# Patient Record
Sex: Male | Born: 1950 | Race: White | Hispanic: No | Marital: Married | Smoking: Former smoker
Health system: Southern US, Community
[De-identification: ages and names within clinical notes are randomized; demographics above are authoritative.]

## PROBLEM LIST (undated history)

## (undated) DIAGNOSIS — R760 Raised antibody titer: Secondary | ICD-10-CM

## (undated) DIAGNOSIS — M1990 Unspecified osteoarthritis, unspecified site: Secondary | ICD-10-CM

## (undated) DIAGNOSIS — Z87898 Personal history of other specified conditions: Secondary | ICD-10-CM

## (undated) DIAGNOSIS — E042 Nontoxic multinodular goiter: Secondary | ICD-10-CM

## (undated) DIAGNOSIS — I10 Essential (primary) hypertension: Secondary | ICD-10-CM

## (undated) DIAGNOSIS — C801 Malignant (primary) neoplasm, unspecified: Secondary | ICD-10-CM

## (undated) DIAGNOSIS — I2699 Other pulmonary embolism without acute cor pulmonale: Secondary | ICD-10-CM

## (undated) DIAGNOSIS — R399 Unspecified symptoms and signs involving the genitourinary system: Secondary | ICD-10-CM

## (undated) DIAGNOSIS — I2510 Atherosclerotic heart disease of native coronary artery without angina pectoris: Secondary | ICD-10-CM

## (undated) DIAGNOSIS — Z86711 Personal history of pulmonary embolism: Secondary | ICD-10-CM

## (undated) DIAGNOSIS — Z9889 Other specified postprocedural states: Secondary | ICD-10-CM

## (undated) DIAGNOSIS — C679 Malignant neoplasm of bladder, unspecified: Secondary | ICD-10-CM

## (undated) DIAGNOSIS — E782 Mixed hyperlipidemia: Secondary | ICD-10-CM

## (undated) DIAGNOSIS — E785 Hyperlipidemia, unspecified: Secondary | ICD-10-CM

## (undated) DIAGNOSIS — Z7901 Long term (current) use of anticoagulants: Secondary | ICD-10-CM

## (undated) HISTORY — PX: HERNIA REPAIR: SHX51

## (undated) HISTORY — PX: REVISION AMPUTATION OF FINGER: SHX2346

## (undated) HISTORY — DX: Essential (primary) hypertension: I10

## (undated) HISTORY — PX: LACERATION REPAIR: SHX5168

## (undated) HISTORY — DX: Malignant (primary) neoplasm, unspecified: C80.1

## (undated) HISTORY — PX: SEPTOPLASTY: SUR1290

## (undated) HISTORY — PX: TRANSURETHRAL RESECTION OF BLADDER TUMOR: SHX2575

## (undated) HISTORY — PX: UMBILICAL HERNIA REPAIR: SHX196

## (undated) HISTORY — PX: THYROID LOBECTOMY: SHX420

## (undated) HISTORY — PX: FRACTURE SURGERY: SHX138

---

## 1997-12-31 ENCOUNTER — Encounter: Payer: Self-pay | Admitting: Pulmonary Disease

## 1997-12-31 ENCOUNTER — Ambulatory Visit (HOSPITAL_COMMUNITY): Admission: RE | Admit: 1997-12-31 | Discharge: 1997-12-31 | Payer: Self-pay | Admitting: Pulmonary Disease

## 1998-01-04 ENCOUNTER — Ambulatory Visit (HOSPITAL_COMMUNITY): Admission: RE | Admit: 1998-01-04 | Discharge: 1998-01-04 | Payer: Self-pay | Admitting: Pulmonary Disease

## 1998-03-08 ENCOUNTER — Other Ambulatory Visit: Admission: RE | Admit: 1998-03-08 | Discharge: 1998-03-08 | Payer: Self-pay | Admitting: Otolaryngology

## 1998-08-25 ENCOUNTER — Encounter: Payer: Self-pay | Admitting: Emergency Medicine

## 1998-08-25 ENCOUNTER — Emergency Department (HOSPITAL_COMMUNITY): Admission: EM | Admit: 1998-08-25 | Discharge: 1998-08-25 | Payer: Self-pay | Admitting: Emergency Medicine

## 1998-09-04 ENCOUNTER — Ambulatory Visit (HOSPITAL_COMMUNITY): Admission: RE | Admit: 1998-09-04 | Discharge: 1998-09-04 | Payer: Self-pay | Admitting: Internal Medicine

## 1998-09-04 ENCOUNTER — Encounter: Payer: Self-pay | Admitting: Internal Medicine

## 1998-10-21 ENCOUNTER — Ambulatory Visit (HOSPITAL_COMMUNITY): Admission: RE | Admit: 1998-10-21 | Discharge: 1998-10-21 | Payer: Self-pay | Admitting: Internal Medicine

## 1998-10-21 ENCOUNTER — Encounter: Payer: Self-pay | Admitting: Internal Medicine

## 1999-09-05 ENCOUNTER — Emergency Department (HOSPITAL_COMMUNITY): Admission: EM | Admit: 1999-09-05 | Discharge: 1999-09-05 | Payer: Self-pay | Admitting: Emergency Medicine

## 1999-09-11 ENCOUNTER — Emergency Department (HOSPITAL_COMMUNITY): Admission: EM | Admit: 1999-09-11 | Discharge: 1999-09-11 | Payer: Self-pay | Admitting: *Deleted

## 1999-09-11 ENCOUNTER — Encounter: Payer: Self-pay | Admitting: Emergency Medicine

## 2000-02-15 ENCOUNTER — Emergency Department (HOSPITAL_COMMUNITY): Admission: EM | Admit: 2000-02-15 | Discharge: 2000-02-15 | Payer: Self-pay | Admitting: Emergency Medicine

## 2000-02-17 ENCOUNTER — Emergency Department (HOSPITAL_COMMUNITY): Admission: EM | Admit: 2000-02-17 | Discharge: 2000-02-17 | Payer: Self-pay | Admitting: *Deleted

## 2006-10-04 ENCOUNTER — Ambulatory Visit: Payer: Self-pay | Admitting: Cardiology

## 2006-10-04 ENCOUNTER — Inpatient Hospital Stay (HOSPITAL_COMMUNITY): Admission: EM | Admit: 2006-10-04 | Discharge: 2006-10-05 | Payer: Self-pay | Admitting: Emergency Medicine

## 2006-10-07 ENCOUNTER — Ambulatory Visit: Payer: Self-pay

## 2006-10-21 ENCOUNTER — Ambulatory Visit: Payer: Self-pay | Admitting: Cardiology

## 2006-11-18 ENCOUNTER — Encounter: Admission: RE | Admit: 2006-11-18 | Discharge: 2006-11-18 | Payer: Self-pay | Admitting: Internal Medicine

## 2006-12-07 ENCOUNTER — Encounter: Admission: RE | Admit: 2006-12-07 | Discharge: 2006-12-07 | Payer: Self-pay | Admitting: Internal Medicine

## 2006-12-07 ENCOUNTER — Encounter (INDEPENDENT_AMBULATORY_CARE_PROVIDER_SITE_OTHER): Payer: Self-pay | Admitting: Interventional Radiology

## 2006-12-07 ENCOUNTER — Other Ambulatory Visit: Admission: RE | Admit: 2006-12-07 | Discharge: 2006-12-07 | Payer: Self-pay | Admitting: Interventional Radiology

## 2007-03-02 ENCOUNTER — Observation Stay (HOSPITAL_COMMUNITY): Admission: EM | Admit: 2007-03-02 | Discharge: 2007-03-03 | Payer: Self-pay | Admitting: *Deleted

## 2007-03-16 ENCOUNTER — Encounter (INDEPENDENT_AMBULATORY_CARE_PROVIDER_SITE_OTHER): Payer: Self-pay | Admitting: Surgery

## 2007-03-16 ENCOUNTER — Ambulatory Visit (HOSPITAL_COMMUNITY): Admission: RE | Admit: 2007-03-16 | Discharge: 2007-03-17 | Payer: Self-pay | Admitting: Surgery

## 2008-01-02 ENCOUNTER — Encounter: Admission: RE | Admit: 2008-01-02 | Discharge: 2008-01-02 | Payer: Self-pay | Admitting: Internal Medicine

## 2008-04-13 ENCOUNTER — Encounter: Admission: RE | Admit: 2008-04-13 | Discharge: 2008-04-13 | Payer: Self-pay | Admitting: Internal Medicine

## 2008-05-08 ENCOUNTER — Ambulatory Visit (HOSPITAL_COMMUNITY): Admission: RE | Admit: 2008-05-08 | Discharge: 2008-05-08 | Payer: Self-pay | Admitting: *Deleted

## 2008-05-08 ENCOUNTER — Encounter (INDEPENDENT_AMBULATORY_CARE_PROVIDER_SITE_OTHER): Payer: Self-pay | Admitting: *Deleted

## 2009-01-07 IMAGING — US US BIOPSY
1 series · 7 of 7 positions shown · non-contrast
Comparison: none

CLINICAL DATA: Patient with history of multinodular goiter and recent thyroid ultrasound at [HOSPITAL] at [HOSPITAL] on 11/18/06 which revealed multiple solid nodules, the largest of which is in the mid to lower pole on the left measuring 6.0 x 4.8 x 5.7 cm.  Request is now made for fine needle aspiration of this dominant mid to lower pole left thyroid  nodule. 
ULTRASOUND-GUIDED FINE NEEDLE ASPIRATION, DOMINANT  MID TO LOWER POLE LEFT THYROID NODULE:

[Series 1: us biopsy · 7 acquisitions, 7 frames shown]
[im 1/7]
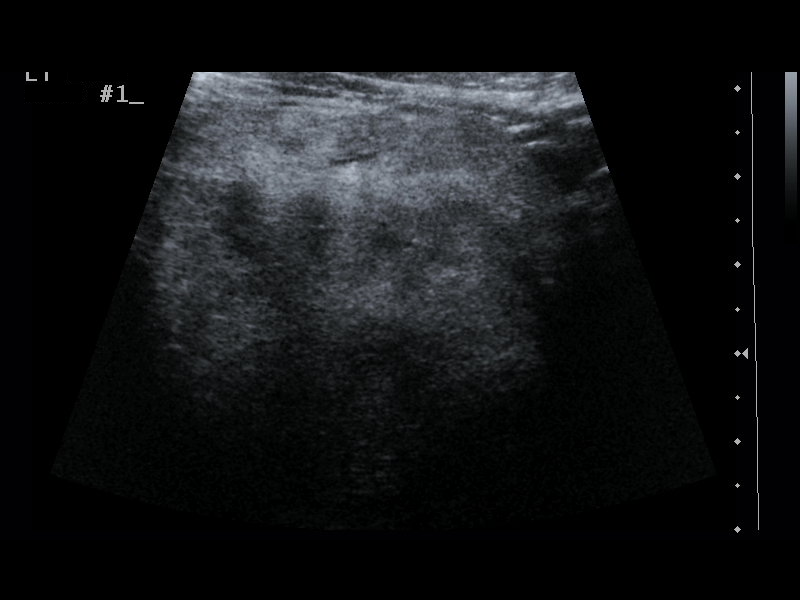
[im 2/7]
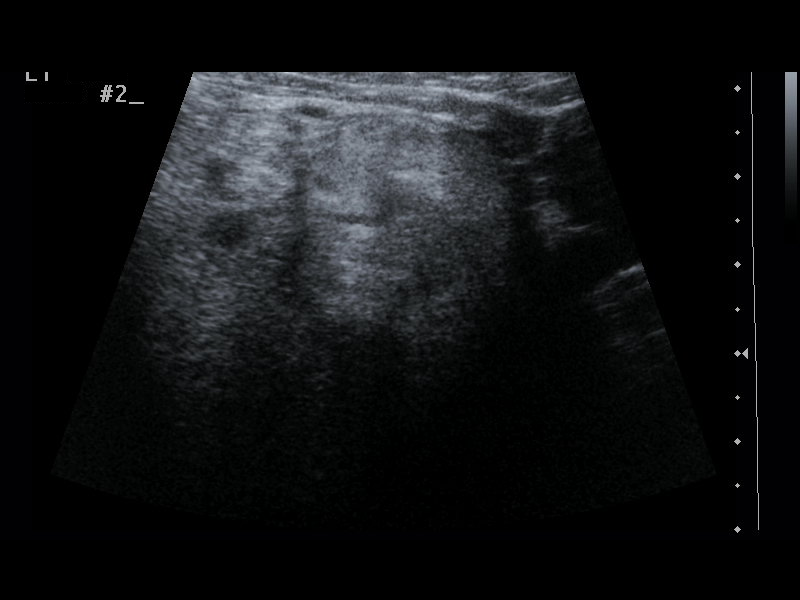
[im 3/7]
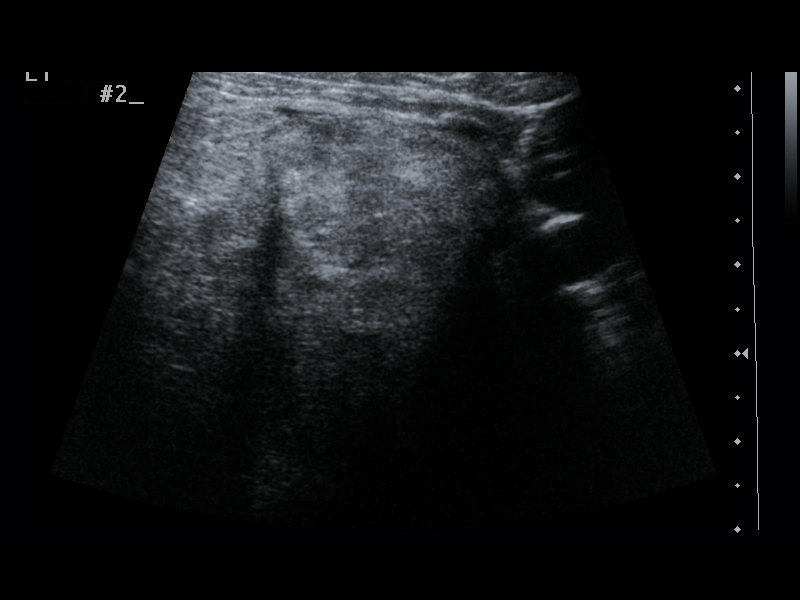
[im 4/7]
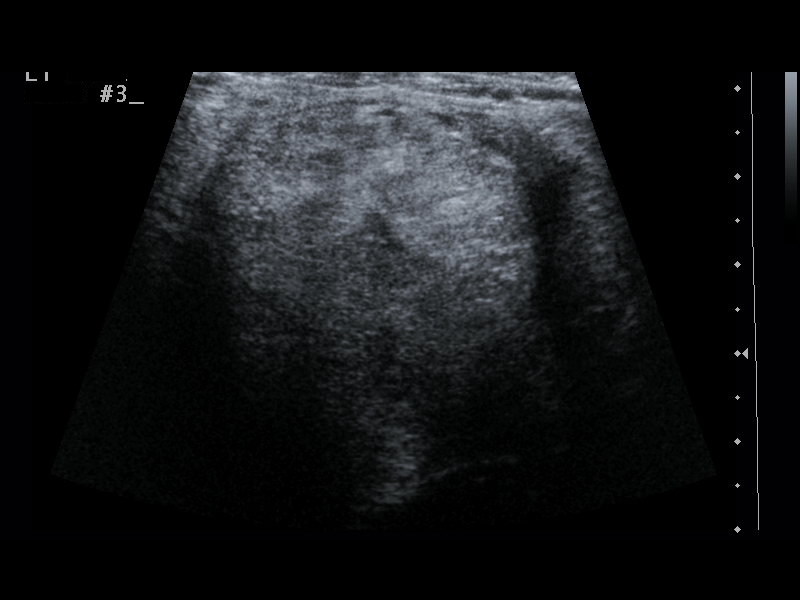
[im 5/7]
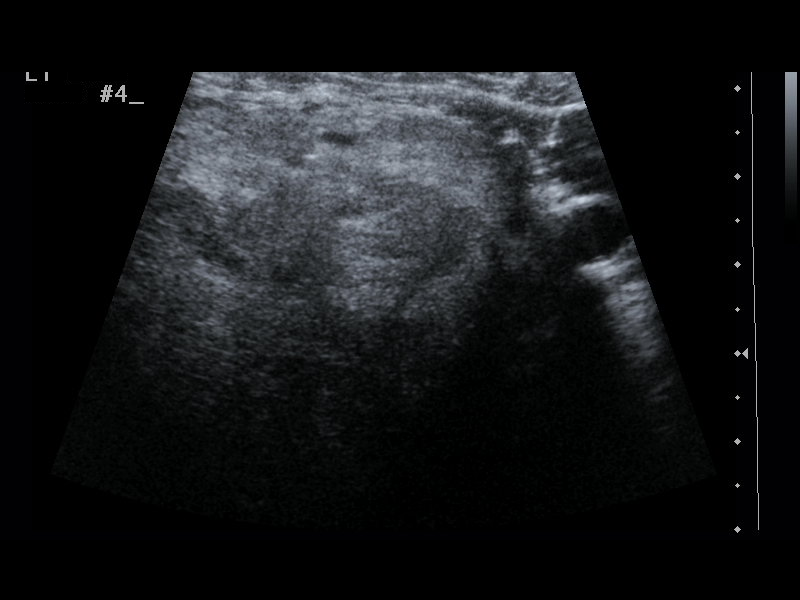
[im 6/7]
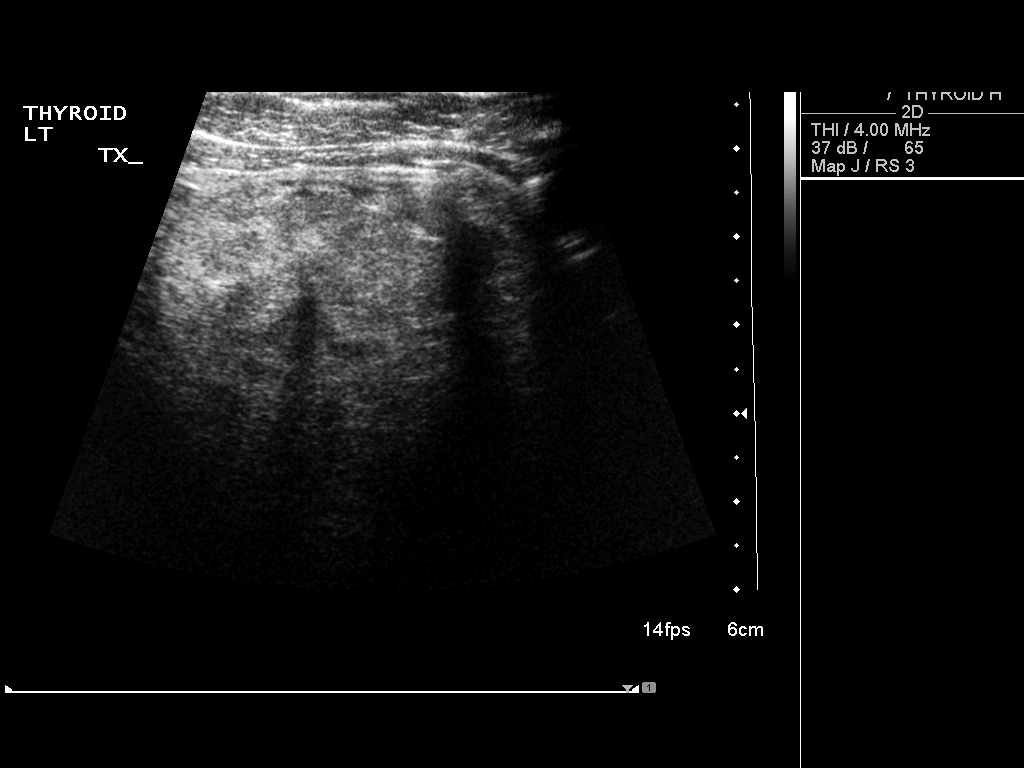
[im 7/7]
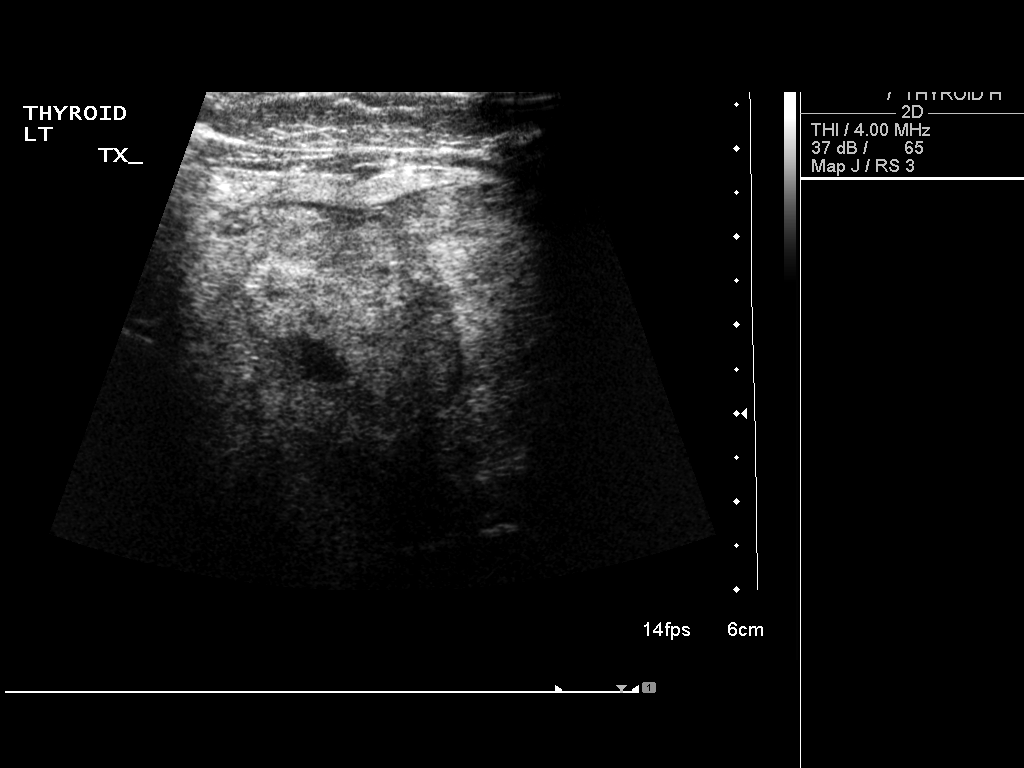

[7 of 7 positions shown; findings below may reference images not displayed]

FINDINGS: The above procedure was thoroughly discussed with the patient and written informed consent was obtained. 
Ultrasound was then performed to localize and mark an adequate site for the biopsy.  The patient was then prepped and draped in a normal sterile fashion.  1% lidocaine was used for local anesthesia.  Under direct ultrasound guidance, four passes were made using 25 gauge hypodermic needle into the dominant nodule located within the mid to lower pole of the left lobe of the thyroid.  Ultrasound confirmed placement of the needle on all four occasions.  The specimens were sent to Pathology for further analysis.  Post procedure imaging demonstrated no hematoma or immediate complication.  The patient tolerated the procedure well.
IMPRESSION: Successful ultrasound-guided fine needle aspiration, dominant mid to lower pole left thyroid nodule.  Final pathology pending.

## 2009-04-02 IMAGING — CR DG ABDOMEN ACUTE W/ 1V CHEST
4 series · 4 of 4 positions shown · non-contrast
Comparison: None.

CLINICAL DATA: Vomiting and nausea.
 ACUTE ABDOMINAL SERIES WITH CHEST ? 3 VIEW:

[w chest pa]
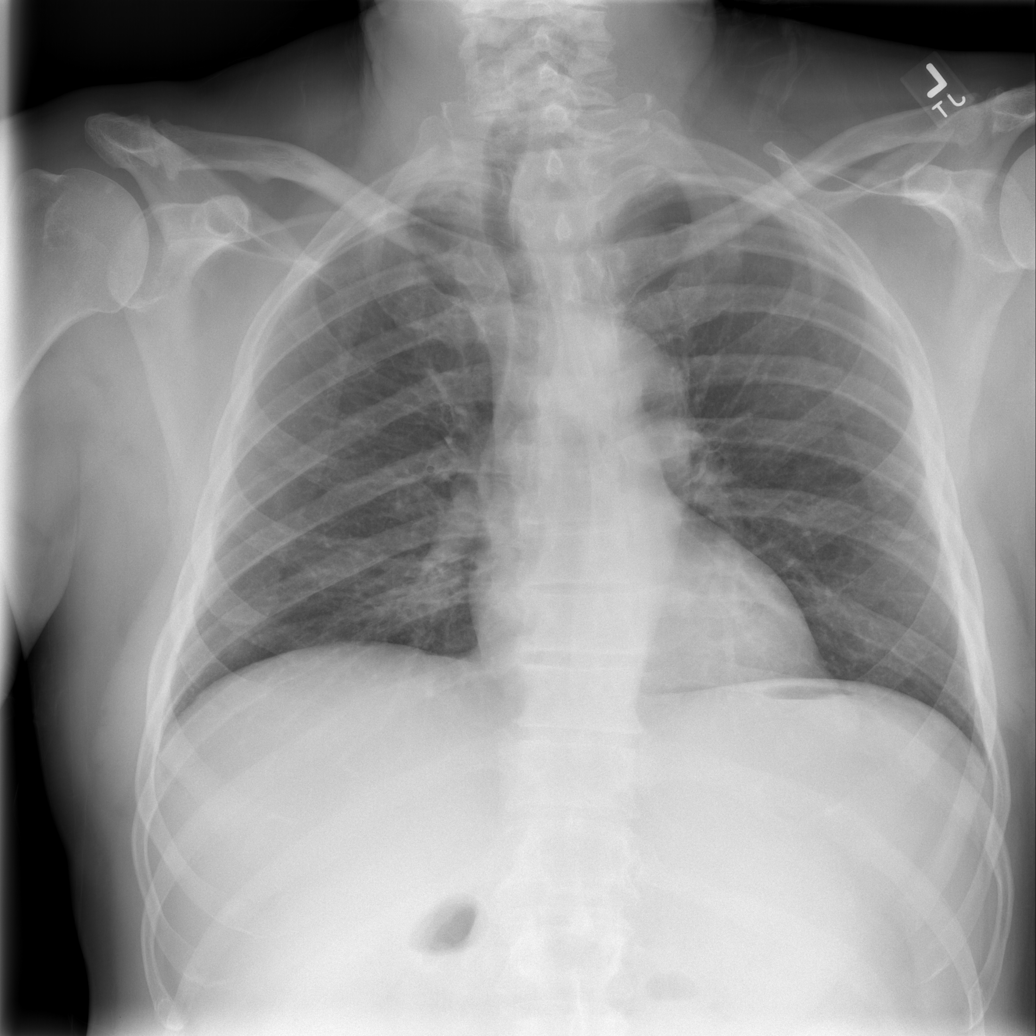

[w abdomen upright * (1 of 2)]
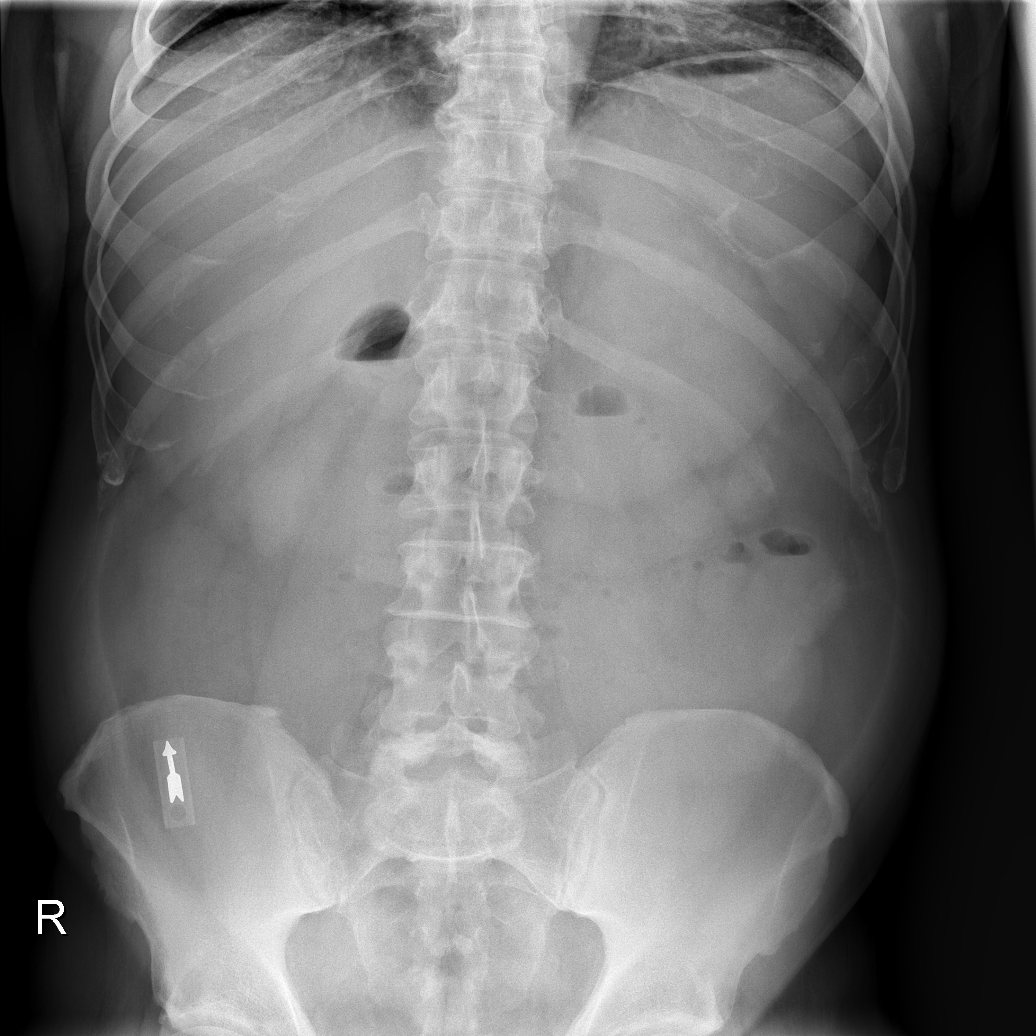

[w abdomen upright * (2 of 2)]
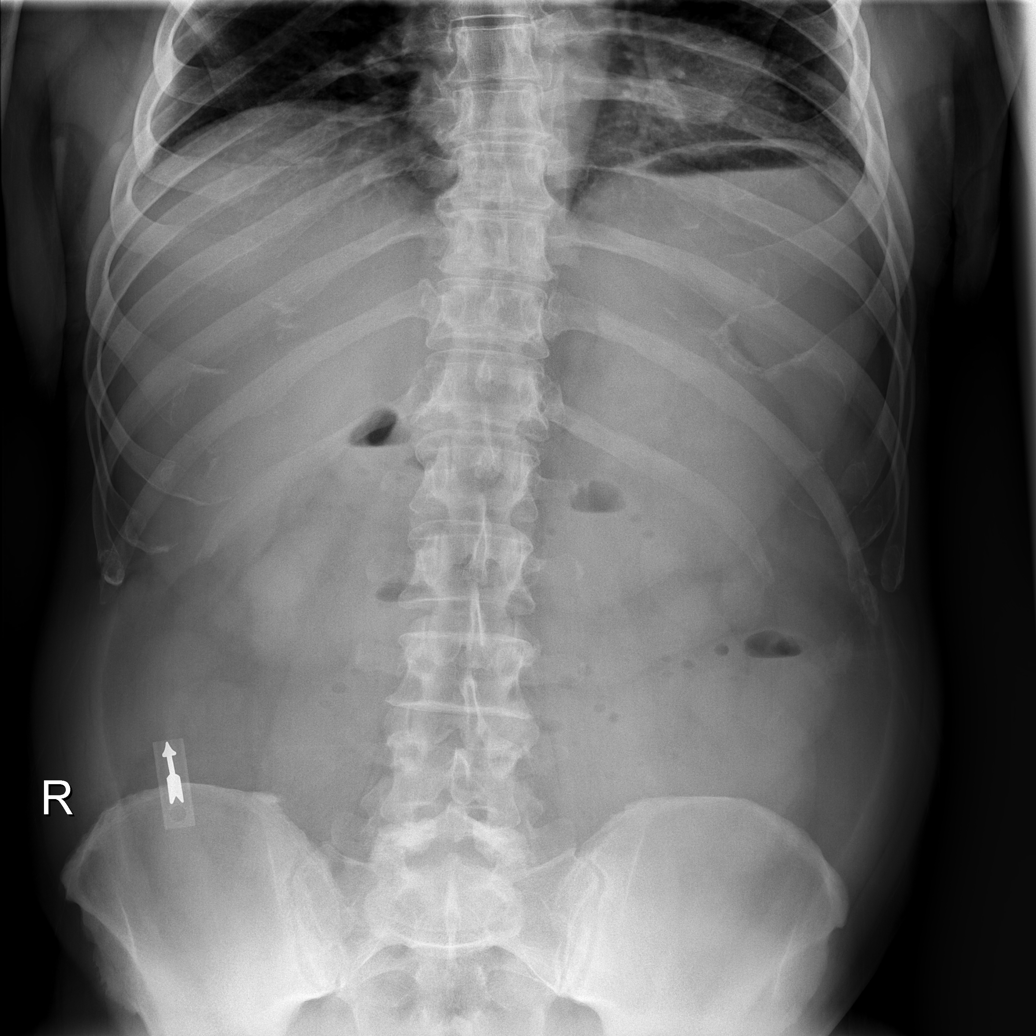

[t abdomen supine]
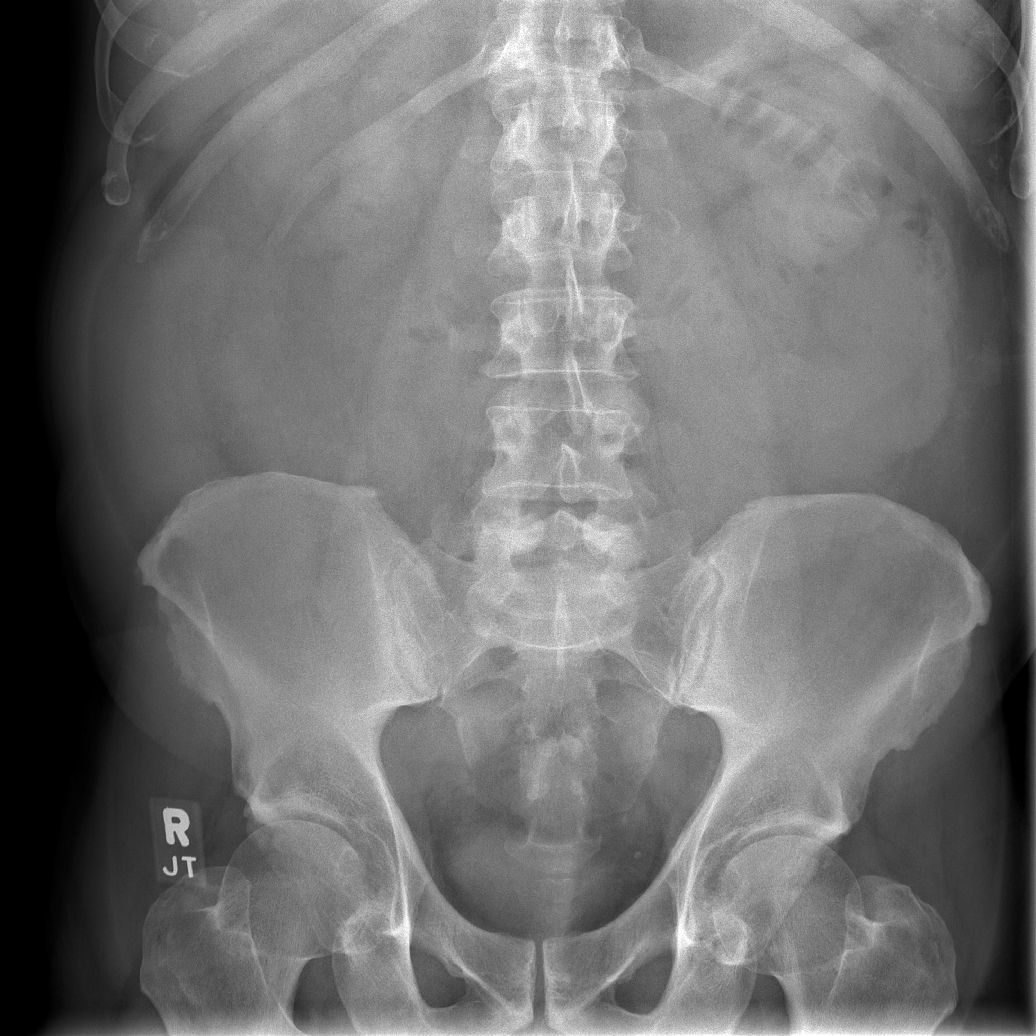

[4 of 4 positions shown; findings below may reference images not displayed]

FINDINGS: A single view of the chest shows the lungs to be clear.  The heart is within normal limits in size.  There is some deviation of the superior tracheal air shadow to the right of midline suggesting possible enlargement of the thyroid gland.  
 Supine and erect views of the abdomen show very little bowel gas with a large amount of fluid filling the stomach.  In view of the large amount of fluid present, a partial small bowel obstruction cannot be excluded, although no evidence of bowel obstruction is seen with certainty.  There are a few scattered air fluid levels.  If clinical concern exists for small bowel obstruction, CT of the abdomen and pelvis would be recommended.  No opaque calculi are noted.  No free air is seen.
IMPRESSION: 1. Fluid filled bowel with very little air present.  Difficult to exclude small bowel obstruction with fluid filled bowel.  Consider CT abdomen and pelvis if warranted clinically.  No free air.
 2. No active lung disease. 
 3. Probable enlargement of the thyroid gland.

## 2009-05-01 ENCOUNTER — Encounter: Admission: RE | Admit: 2009-05-01 | Discharge: 2009-05-01 | Payer: Self-pay | Admitting: Internal Medicine

## 2009-06-26 ENCOUNTER — Encounter: Admission: RE | Admit: 2009-06-26 | Discharge: 2009-06-26 | Payer: Self-pay | Admitting: Endocrinology

## 2009-06-26 ENCOUNTER — Other Ambulatory Visit: Admission: RE | Admit: 2009-06-26 | Discharge: 2009-06-26 | Payer: Self-pay | Admitting: Interventional Radiology

## 2010-03-16 ENCOUNTER — Encounter: Payer: Self-pay | Admitting: Internal Medicine

## 2010-05-05 ENCOUNTER — Emergency Department (HOSPITAL_COMMUNITY)
Admission: EM | Admit: 2010-05-05 | Discharge: 2010-05-05 | Disposition: A | Discharge status: Home / Self Care | Payer: BC Managed Care – PPO | Attending: Emergency Medicine | Admitting: Emergency Medicine

## 2010-05-05 DIAGNOSIS — W261XXA Contact with sword or dagger, initial encounter: Secondary | ICD-10-CM | POA: Insufficient documentation

## 2010-05-05 DIAGNOSIS — S61209A Unspecified open wound of unspecified finger without damage to nail, initial encounter: Secondary | ICD-10-CM | POA: Insufficient documentation

## 2010-05-05 DIAGNOSIS — W260XXA Contact with knife, initial encounter: Secondary | ICD-10-CM | POA: Insufficient documentation

## 2010-05-05 DIAGNOSIS — Y9389 Activity, other specified: Secondary | ICD-10-CM | POA: Insufficient documentation

## 2010-05-05 DIAGNOSIS — I10 Essential (primary) hypertension: Secondary | ICD-10-CM | POA: Insufficient documentation

## 2010-05-05 DIAGNOSIS — E785 Hyperlipidemia, unspecified: Secondary | ICD-10-CM | POA: Insufficient documentation

## 2010-05-13 LAB — CULTURE, ROUTINE-ABSCESS

## 2010-06-25 ENCOUNTER — Other Ambulatory Visit: Payer: Self-pay | Admitting: Internal Medicine

## 2010-06-25 DIAGNOSIS — R221 Localized swelling, mass and lump, neck: Secondary | ICD-10-CM

## 2010-07-04 ENCOUNTER — Encounter (HOSPITAL_BASED_OUTPATIENT_CLINIC_OR_DEPARTMENT_OTHER)
Admission: RE | Admit: 2010-07-04 | Discharge: 2010-07-04 | Disposition: A | Discharge status: Home / Self Care | Payer: BC Managed Care – PPO | Source: Ambulatory Visit | Attending: Surgery | Admitting: Surgery

## 2010-07-04 ENCOUNTER — Other Ambulatory Visit: Payer: Self-pay | Admitting: Surgery

## 2010-07-04 ENCOUNTER — Ambulatory Visit
Admission: RE | Admit: 2010-07-04 | Discharge: 2010-07-04 | Disposition: A | Discharge status: Home / Self Care | Payer: BC Managed Care – PPO | Source: Ambulatory Visit | Attending: Surgery | Admitting: Surgery

## 2010-07-04 DIAGNOSIS — Z01811 Encounter for preprocedural respiratory examination: Secondary | ICD-10-CM

## 2010-07-04 LAB — POCT I-STAT, CHEM 8
Calcium, Ion: 1.15 mmol/L (ref 1.12–1.32)
Chloride: 104 mEq/L (ref 96–112)
HCT: 48 % (ref 39.0–52.0)
TCO2: 25 mmol/L (ref 0–100)

## 2010-07-08 NOTE — Op Note (Signed)
NAME:  Gerald Morales, Gerald Morales              ACCOUNT NO.:  [id number]   MEDICAL RECORD NO.:  [id number]          PATIENT TYPE:  AMB   LOCATION:  ENDO                         FACILITY:  [facility]   PHYSICIAN:  Georgiana Spinner, M.D.    DATE OF BIRTH:  [birth year]   DATE OF PROCEDURE:  05/08/2008  DATE OF DISCHARGE:                               OPERATIVE REPORT   PROCEDURE:  Colonoscopy.   INDICATIONS:  Colon cancer screening.   ANESTHESIA:  Fentanyl 75 mcg, Versed 8 mg.   PROCEDURE:  With the patient mildly sedated in the left lateral  decubitus position, a rectal examination was performed which was  unremarkable.  Prostate felt normal to my exam.  Subsequently, the  Pentax videoscopic pediatric colonoscope was inserted in the rectum and  passed under direct vision to the cecum, identified by ileocecal valve  and appendiceal orifice, both of which were photographed.  From this  point, the colonoscope was slowly withdrawn, taking circumferential  views of colonic mucosa, stopping only in the rectum where a small polyp  was seen, photographed and removed using hot biopsy forceps technique  setting of 20/150 blended current.  We then pulled back to the distal  rectum which appeared otherwise normal on direct view and showed small  hemorrhoids on retroflexed view.  The endoscope was straightened and  withdrawn.  The patient's vital signs and pulse oximeter remained  stable.  The patient tolerated the procedure well without apparent  complications.   FINDINGS:  Small internal hemorrhoids, small polyp of rectum.  Await  biopsy report.  The patient will call me for results and follow-up with  me as an outpatient.           ______________________________  Georgiana Spinner, M.D.     GMO/MEDQ  D:  05/08/2008  T:  05/08/2008  Job:  [id number]

## 2010-07-08 NOTE — H&P (Signed)
NAME:  Gerald Morales, HURLBUT NO.:  [id number]   MEDICAL RECORD NO.:  [id number]          PATIENT TYPE:  EMS   LOCATION:  ED                           FACILITY:  [facility]   PHYSICIAN:  Altha Harm, MDDATE OF BIRTH:  October 31, 1950   DATE OF ADMISSION:  03/02/2007  DATE OF DISCHARGE:                              HISTORY & PHYSICAL   CHIEF COMPLAINT:  Vomiting and diarrhea x1 day.   HISTORY OF PRESENT ILLNESS:  This is a 60 year old gentleman who has  been in his normal state of health until approximately 24 hours ago.  He  started having nausea and vomiting.  The patient states that his emesis  is non-bilious, non-hematemesis.  He has also been having diarrhea.  He  states that his nausea, vomiting and diarrhea have been episodes too  numerous to count.  The patient states that he had a sick contact in his  grandson, who has had the same ailment approximately three to four days  ago.  The patient states he has been having some chills but he has had  no measured fevers.  He has had no recent travel.  He has had no unusual  food and in particular no use of peanut butter.  There are no other  family members who have had this, except for his grandson, whom he spent  quite a bit of time with.  He states that he has had some decrease in  urine output.  His last emesis was approximately two hours ago while  here in the emergency room.   PAST MEDICAL HISTORY:  1. Significant for hypertension.  2. Hyperlipidemia.  3. Retrosternal goiter.  Surgery is scheduled for March 16, 2007.  4. Chronic back pain.   FAMILY HISTORY:  Significant for hypertension.   SOCIAL HISTORY:  The patient denies any tobacco, alcohol or drug use.   CURRENT MEDICATIONS:  1. Aspirin 81 mg p.o. daily.  2. Crestor.  3. Benicar; however, the patient does not know the doses of these      current medications.   ALLERGIES:  No known drug allergies.   PRIMARY CARE PHYSICIAN:  Dr. Soyla Murphy. Pharr.   REVIEW OF SYSTEMS:  On a systems review, all systems are negative except  as noted in the HPI.   LABORATORY DATA:  In the emergency room laboratory studies showed the  following:  White blood cell count 13.3, hemoglobin 18.4, hematocrit  52.6, platelets 196.  Sodium 138, potassium 3.4, chloride 104,  bicarbonate 22, BUN 23, creatinine 1.44.   PHYSICAL EXAMINATION:  GENERAL:  The patient is laying in bed, in no  acute distress.  VITAL SIGNS:  Temperature 98.2 degrees orally, blood pressure 130/87,  heart rate 82, respirations 16, O2 saturation 97% on room air.  HEENT:  Normocephalic and atraumatic.  Pupils equal, round, reactive to  light and accommodation.  Extraocular movements intact.  Tympanic  membranes clear bilaterally.  Oropharynx moist.  No exudates, erythema  or lesions noted.  NECK:  Trachea midline.  No masses, no thyromegaly, no jugular venous  distention, no carotid bruits.  LUNGS:  The patient has normal respiratory effort.  Equal excursion  bilaterally.  Clear to auscultation.  No rales, rhonchi or wheezing noted.  CARDIOVASCULAR:  He is mildly tachycardic.  Normal S1 and S2.  No  murmurs, rubs or gallops noted.  PMI is non-displaced.  No heaves or  thrills on palpation.  ABDOMEN:  Mildly distended, obese, soft, nontender.  No masses, no  hepatosplenomegaly noted.  No guarding, no rebound.  LYMPHS:  No cervical, axillary or inguinal lymphadenopathy noted.  MUSCULOSKELETAL:  No swelling or erythema around the joints.  A full  range of motion in the joints.  There is no spinal tenderness.  NEUROLOGIC:  Cranial nerves II-XII  grossly intact.  No focal  neurological deficits noted.  Deep tendon reflexes 2+ bilaterally in the  upper and lower extremities.  The patient is able to move all  extremities against gravity with resistance.  Sensation to light touch  and proprioception.  PSYCHIATRIC:  Alert and oriented x3.  Good insight and cognition.  Good  recent and remote  recall.   ASSESSMENT/PLAN:  This is a patient who presents with gastroenteritis,  likely viral.  The patient has a mildly elevated white blood cell count  which is attributed to hemoconcentration.  If the patient continues to  have diarrhea, stool will be sent for evaluation, including Clostridium  difficile and white blood cells.  At this point I will not continue to  treat the patient with Imodium, but rather hydrate the patient and allow  for the gastroenteritis to resolve itself.  It is likely a self-limited  disorder and as long as the patient is able to maintain oral hydration  without any further deterioration in his clinical status, he should be  able to be discharged within the next 24-48 hours.      Altha Harm, MD  Electronically Signed     MAM/MEDQ  D:  03/02/2007  T:  03/02/2007  Job:  [id number]   cc:   Soyla Murphy. Renne Crigler, M.D.  Fax: [phone]

## 2010-07-08 NOTE — Discharge Summary (Signed)
NAME:  Gerald Morales, Gerald Morales              ACCOUNT NO.:  [id number]   MEDICAL RECORD NO.:  [id number]          PATIENT TYPE:  INP   LOCATION:  1434                         [facility]ACILITY:  [facility]   PHYSICIAN:  Isidor Holts, M.D.  DATE OF BIRTH:  07-10-1950   DATE OF ADMISSION:  10/04/2006  DATE OF DISCHARGE:  10/05/2006                               DISCHARGE SUMMARY   PRIMARY MEDICAL DOCTOR:  Soyla Murphy. Renne Crigler, M.D.   DISCHARGE DIAGNOSES:  1. Atypical chest pain, rule out coronary artery disease.  2. Back pain, likely musculoskeletal, +/- radiculopathy.  3. Smoking history.  4. Hypertension.  5. Dyslipidemia.  6. Retrosternal goiter.   DISCHARGE MEDICATIONS:  1. Aspirin 81 mg daily (over-the-counter).  2. Crestor 20 mg p.o. daily (more than 10 mg p.o. daily).  3. Lisinopril 40 mg p.o. daily.  4. Darvocet N-100 one p.o. p.r.n. q.6 h .   PROCEDURES:  Chest CT angiogram dated October 04, 2006.  This showed no  pulmonary embolus, no borderline cardiomegaly without failure, large  substernal thyroid goiter, displaces the airway to the right and narrows  the airway to 7 mm, low lung volumes.   CONSULTATIONS:  1. Madolyn Frieze Jens Som, MD, [facility], Cardiology.  2. Rollene Rotunda, MD, [facility], Cardiology.   ADMISSION HISTORY:  As H&P notes of October 04, 2006, dictated by Dr.  Marthann Schiller.  However, in brief, this is a 60 year old male, with  known history of hypertension, dyslipidemia, who presented with being in  right upper back pain, radiating towards the right chest wall anteriorly  and the right arm, associated shortness of breath, no diaphoresis or  nausea.  The patient does have a family history significant for type 2  diabetes mellitus and congestive heart failure.  He was therefore  admitted for further evaluation, investigation and management.   CLINICAL COURSE:  #1 - CHEST PAIN.  This has several atypical features.  However, the patient does have risk factors for coronary disease,  including dyslipidemia, hypertension as well as positive family history  of heart disease.  Cardiac enzymes were cycleed, and remained  unelevated.  Chest CT angiogram was done on October 04, 2006, to rule out  pulmonary embolism.  This was ruled out.  However, it did demonstrate a  large retrosternal thyroid goiter.  EKG showed no acute ischemic  changes.  Cardiology consultation was called which was kindly provided  by Dr. Angelina Sheriff and Dr. Olga Millers who have opined that the  patient has no acute coronary syndrome and was stable for discharge on  October 05, 2006, with arrangements put in place for stress Cardiolite  study on October 07, 2006.   #2 - HYPERTENSION.  The patient's blood pressure during this  hospitalization was significantly elevated between 150 systolic to 160  systolic.  He was therefore commenced on Lisinopril therapy.  Unfortunately, we are unable to use Beta blocker, because he has  persistent sinus bradycardia in the 50's.   #3 - SMOKING HISTORY.  The patient smokes cigars.  He had smoked pipes  in the past.  He has been counseled appropriately.   #4 -  DYSLIPIDEMIA. The patient's lipid profile was done and demonstrated  total cholesterol of 247, triglyceride 302, HDL 36, LDL 151.  Clearly,  the patient's anti-lipid treatment has to be escalated.  He is currently  on Crestor 10 mg p.o. daily.  We have increased this to 20 mg p.o.  daily.   #5 - MUSCULOSKELETAL BACK PAIN.  The patient continues to complain of  upper back pain.  Likely, this is due to DJD with radiculopathy.  He has  been commenced on p.r.n. Darvocet N-100.   #6 - MULTINODULAR RETROSTERNAL GOITER.  As mentioned in the clinical  course above, the patient underwent chest CT angiogram on October 04, 2006, which was negative for pulmonary embolism, but demonstrated a  large substernal goiter which displaces the airway to the right and  narrows airway to 7 mm.  Review of the patient's electronic  medical  records showed that he had a nuclear medicine thyroid scan on October 21, 1998, and this confirmed a multinodular goiter.  TSH during this  hospitalization was 0.488, i.e. euthyroid.   DISPOSITION:  The patient was considered sufficiently clinically stable  to be discharged on October 05, 2006.   DISCHARGE INSTRUCTIONS:  1. Diet:  Heart healthy.  2. Activity:  As tolerated .   FOLLOWUP INSTRUCTIONS:  The patient is instructed to show up for stress  Cardiolite study on October 07, 2006, at 7:45 a.m.  This has already been  scheduled, and the patient has been given details of appointment.  He is  to  follow up subsequently with Dr. Olga Millers in his office.  Dr  .Ludwig Clarks office will contact the patient to schedule appointment.  In  addition, the patient is to follow up routinely with his primary M.D.,  Dr. Merri Brunette in 1-2 weeks.      Isidor Holts, M.D.  Electronically Signed     CO/MEDQ  D:  10/05/2006  T:  10/05/2006  Job:  [id number]   cc:   Soyla Murphy. Renne Crigler, M.D.  Fax: [phone]   Madolyn Frieze. Jens Som, MD, [employer]  [address]  [address]  [place]  [place] [address]   Rollene Rotunda, MD, [facility]  [address]  [address]  [place]  [place] [address]

## 2010-07-08 NOTE — Consult Note (Signed)
NAME:  Gerald Morales              ACCOUNT NO.:  [id number]   MEDICAL RECORD NO.:  [id number]          PATIENT TYPE:  INP   LOCATION:  1434                         FACILITY:  [facility]   PHYSICIAN:  Madolyn Frieze. Jens Som, MD, FACCDATE OF BIRTH:  07-Jan-1951   DATE OF CONSULTATION:  10/04/2006  DATE OF DISCHARGE:                                 CONSULTATION   PRIMARY CARE PHYSICIAN:  Soyla Murphy. Renne Crigler, M.D.   REASON FOR CONSULTATION:  Mr. Gerald Morales is a 60 year old white male who  presented to the [facility] Emergency Room and is being  admitted by [facility], secondary to chest and back discomfort.  He  stated yesterday that he watched the Olympics all day long and at around  9:15 p.m. he noticed an aching feeling in his right chest, as well as a  very sharp knife-like pain between his shoulder blades.  He lay down and  went to sleep.  Around 12:30 a.m. he woke up and felt his symptoms were  worse.  He could not get comfortable.  He said the discomfort did not  change with lying flat and did not get worse with movement.  He did  notice some clamminess.  He specifically denied any nausea, vomiting or  shortness of breath.  He states that the discomfort is still there, but  not as bad.  He denies prior occurrences.   ALLERGIES:  No known drug allergies.   CURRENT MEDICATIONS:  1. A blood pressure medication at home.  2. A cholesterol medication at home.  He is not sure of the names, and      he does not take these on a regular basis.   PAST MEDICAL HISTORY:  1. Hypertension, which he does not monitor.  2. Hyperlipidemia, unknown when last checked.  3. Multi-nodular goiter, unknown last evaluation.  He did have a      stress test approximately 15 years ago and was told that it was      okay.  He specifically denies any diabetes, chronic obstructive      pulmonary disease, myocardial infarction, CVA or bleeding      dyscrasias.   PAST SURGICAL HISTORY:  1. Notable for a deviated  septal repair.  2. Right finger and left hand surgery.   SOCIAL HISTORY:  He resides in [place] with his wife, daughter and  grandson.  He has two other sons.  All are alive and well.  There other  grandchildren.  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  He smokes  cigars.  He is not sure of the frequency.  He drinks one to two beers a  day.  Denies any drugs or herbal medications.  Does not follow a  specific diet.  Does not exercise.   FAMILY HISTORY:  His mother died in her 1's, with a history of  congestive heart failure, diabetes and hypertension.  His father died at  age 65, with congestive heart failure and hypertension.  He has one  brother deceased, secondary to suicide.  One sister is alive and well.   REVIEW OF SYSTEMS:  In  addition to the above, is notable for occasional  sinus congestion, glasses, upper dentures, chronic dyspnea on exertion  when the temperature in his work place is greater than 130 degrees,  coughing, snoring, bilateral knee arthralgias, occasional diarrhea that  he attributes to drinking too much beer.   PHYSICAL EXAMINATION:  GENERAL:  A well-developed and well-nourished  obese white male, in no apparent distress.  VITAL SIGNS:  Temperature 97.4 degrees, initial blood pressure 175/115,  pulse 70 and regular, respirations 18, 96% saturation on room air.  Blood pressure in the right arm is 169/98, blood pressure in the left  arm 157/98.  These were done by me.  HEENT:  Unremarkable.  NECK:  Supple without adenopathy, jugular venous distention or carotid  bruits.  He does have a large goiter.  CHEST:  Symmetrical excursion.  Clear to auscultation.  HEART:  PMI is not displaced.  A regular rate and rhythm.  I did not  appreciate any murmurs, rubs, clicks or gallops.  I did not appreciate  any abdominal or femoral bruits.  All pulses are symmetrical and intact,  particularly in his upper extremities.  ABDOMEN:  Obese, bowel sounds present, without  organomegaly, masses or  tenderness.  EXTREMITIES:  Negative for clubbing, cyanosis or edema.  MUSCULOSKELETAL:  Grossly unremarkable.  NEUROLOGIC:  Unremarkable.   LABORATORY DATA:  A chest x-ray was not done.  A chest CT, however,  showed no pulmonary embolism.  He had a large substernal goiter,  displacing the trachea to the right and narrows the trachea  approximately 7 mm.  Dr. Madolyn Frieze. Crenshaw reviewed and did not see any  evidence of dissection or aortic enlargement.   An electrocardiogram shows a normal sinus rhythm, normal axis, early  repolarization, normal intervals.  Old electrocardiograms are not  available for comparison.   Admission H&H is 16.8 and 47.6, normal indices, platelets 216, WBCs 6.  Sodium 138, potassium 3.7, BUN 15, creatinine 0.95, glucose 103.  Normal  liver function tests.  PTT 25, PT 11.9.  Point of care negative x2.  Urinalysis negative.   IMPRESSION:  1. Prolonged atypical back and chest discomfort.  Point of care      markers and electrocardiograms are unremarkable.  2. Hypertension that does not appear to be controlled.  3. Noncompliance with medications and probable medical evaluation.   RECOMMENDATIONS:  Dr. Jens Som reviewed the patient's history and spoke  with the patient and examined the patient and agrees with the above.  Dr. Jens Som feels that he could proceed with an outpatient Myoview for  further  evaluation, and then follow up.  His discomfort may be musculoskeletal;  however, primary care should address is blood pressure and recommend  checking lipids and a TSH, given his goiter, and further evaluation of  his goiter.   I will arrange an outpatient stress Myoview as well as a follow-up  appointment.      Joellyn Rued, PA-C      Madolyn Frieze Jens Som, MD, [facility]  Electronically Signed    EW/MEDQ  D:  10/04/2006  T:  10/05/2006  Job:  [id number]   cc:   Soyla Murphy. Renne Crigler, M.D.  Fax: [phone]

## 2010-07-08 NOTE — Discharge Summary (Signed)
NAME:  Gerald Morales, Gerald Morales              ACCOUNT NO.:  [id number]   MEDICAL RECORD NO.:  [id number]          PATIENT TYPE:  INP   LOCATION:  1313                         FACILITY:  [facility]   PHYSICIAN:  Altha Harm, MDDATE OF BIRTH:  1950/07/13   DATE OF ADMISSION:  03/02/2007  DATE OF DISCHARGE:  03/03/2007                               DISCHARGE SUMMARY   DISCHARGE DISPOSITION:  Home.   FINAL DISCHARGE DIAGNOSIS:  Gastroenteritis resolved.   SECONDARY DIAGNOSES:  1. Hypertension.  2. Retrosternal goiter scheduled for surgery on the 21st.  3. Hypertension.   DISCHARGE MEDICATIONS:  1. Crestor 10 mg p.o. daily.  2. Benicar 25 mg p.o. daily.   MEDICATIONS ON HOLD:  Aspirin 81 mg p.o. daily.   PRIMARY CARE PHYSICIAN:  Dr. Merri Brunette.   CODE STATUS:  Full code.   ALLERGIES:  No known drug allergies.   CHIEF COMPLAINT:  Nausea and vomiting and diarrhea.   HISTORY OF PRESENT ILLNESS:  Please see H&P dictated on March 02, 2007  for details of the HPI.   HOSPITAL COURSE:  Gastroenteritis with intractable vomiting.  The  patient was having intractable emesis and unable to keep anything down  .  The patient was brought in on an observation basis, given aggressive  hydration, and started on clear liquids.  The patient tolerated clear  liquids and is being advanced to regular diet.  If the patient tolerates  this diet, then he will be discharged home.   CONDITION AT TIME OF DISCHARGE:  The patient is afebrile.  Vital signs  stable.  No further diarrhea or emesis reported.   FOLLOWUP:  The patient to follow up with his primary care physician on a  p.r.n. basis.      Altha Harm, MD  Electronically Signed     MAM/MEDQ  D:  03/03/2007  T:  03/03/2007  Job:  [id number]   cc:   Soyla Murphy. Renne Crigler, M.D.  Fax: [phone]

## 2010-07-08 NOTE — Op Note (Signed)
NAME:  Gerald Morales, Gerald Morales              ACCOUNT NO.:  [id number]   MEDICAL RECORD NO.:  [id number]          PATIENT TYPE:  OIB   LOCATION:  2550                         FACILITY:  [facility]   PHYSICIAN:  Thornton Park. Daphine Deutscher, MD  DATE OF BIRTH:  Sep 11, 1950   DATE OF PROCEDURE:  03/16/2007  DATE OF DISCHARGE:                               OPERATIVE REPORT   CCS NUMBER:  [id number].   PREOPERATIVE DIAGNOSIS:  Substernal goiter with left thyroid lobe  compressing the trachea and pushing it to the right.   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   PROCEDURE:  Left lobectomy, isthmusectomy, examination of the right  lobe.   SURGEON:  Thornton Park. Daphine Deutscher, M.D.   ASSISTANT:  Leonie Man, M.D.   ANESTHESIA:  General endotracheal.   DESCRIPTION OF PROCEDURE:  [name] is a 60 year old white  male who has had some swallowing difficulties and was found on workup to  have a large goiter and on CT scan was found to have a linear type  compression of his trachea as it was pushed to the right side by a large  left thyroid goiter which was mainly substernal.  This was about over  8.5-cm in greatest dimension.   The patient was taken to Room 16 at [facility].  He was given general  anesthesia, intubated and was prepped with Techni-Care and draped  sterilely.  Substernal instruments were available, and Dr. Edwyna Shell was on  standby in case we needed him to help retrieve this substernal goiter.  However, we went ahead and divided the skin two fingerbreadths above the  clavicles and notch and then carried this down through the platysma.  Superior and inferior flaps were elevated.  He did have some difficulty  with some bleeding of one of the superior vessels which I oversewed with  a figure-of-eight suture of 4-0 Vicryl.  Once the flaps were raised, the  Mahorner retractor was placed, and I focused my attention on the left  side.  I  separated the strap muscles in the midline and retracted the  strap muscles on the left, and then with gentle finger dissection went  ahead mobilized this large gland.  It was a multilobulated affair, and  once I got it mobilized, I brought it up into the wound.  I had to take  down the superior pole with a clamp and ties of the superior pole and  divided the staying side with Harmonic scalpel and then teased the  thyroid from the surrounding tissue.  Once up in the wound, I could see  the recurrent laryngeal nerve and the inferior thyroid artery, and these  were stayed back where they had been, and I stayed right on the gland  and dissected it from these structures.  In so doing, I felt that I left  the parathyroids intact, recurrent nerve intact, and then when I had  mobilized in the midline, I went ahead and transected the gland, leaving  a little small remnant but mainly getting this large gland.  I had  harvested  most of it from below the clavicle where it did in fact  compress the trachea.  Once it was removed, it was sent for frozen  section which revealed it to be a benign neoplastic goiter.  In the  meantime, we examined the left thyroid lobe which appeared to be fairly  diminutive by comparison.  CT scan was reevaluated, and again there was  really no contribution from the right side to this compression, and we  felt like we had treated the symptomatic side and did not need to take  out his right lobe.  There were no suspicious nodules palpated either.  That being done, we inspected for bleeders and put in some small tiny  clips and irrigated very well.  Some Surgicel was left behind.  I put a  little quarter-inch Penrose drain fenestrated into the large cavity that  was down on the left substernal region.  The midline was then  approximated interrupted 4-0 Vicryls, and the platysma muscles were  closed with interrupted 4-0 Vicryls, and final closure was with staples.  The  patient seemed to tolerate the procedure well.  He was taken to  recovery room in satisfactory condition.      Thornton Park Daphine Deutscher, MD  Electronically Signed     MBM/MEDQ  D:  03/16/2007  T:  03/16/2007  Job:  [id number]   cc:   Soyla Murphy. Renne Crigler, M.D.

## 2010-07-08 NOTE — Assessment & Plan Note (Signed)
[facility]                            CARDIOLOGY OFFICE NOTE   NAME:Parzych, KENYATTE CHATMON                     MRN:          [id number]  DATE:10/21/2006                            DOB:          08-19-1950    Mr. Dannemiller is a pleasant gentleman that was recently seen in the [facility] emergency room for atypical back and chest pain. At the time, we  felt that it was most likely not cardiac. He did rule out for myocardial  infarction with serial enzymes. A CT scan showed no pulmonary embolus,  although there was a goiter noted. He subsequently had a Myoview  performed as an outpatient on 10/07/2006. He was found to have no  ischemia or infarction and his ejection fraction was 57%. Since  discharge, he has not had further chest pain and he denies any dyspnea,  pedal edema, or syncope. He is following up with Dr. Renne Crigler concerning  his goiter, hypertension, and hyperlipidemia.   MEDICATIONS:  1. Aspirin 81 mg daily.  2. Crestor 20 mg daily.  3. Lisinopril 40 mg daily.  4. Mobic.   PHYSICAL EXAMINATION:  VITAL SIGNS:  Blood pressure 164/98, pulse 66,  weight 220 pounds.  HEENT:  Normal.  NECK:  Supple with no bruits.  CHEST:  Clear.  CARDIOVASCULAR:  Regular rate and rhythm.  ABDOMEN:  Benign.  EXTREMITIES:  No edema.   DIAGNOSES:  1. Recent chest pain - this appears to have been musculoskeletal. His      Myoview showed no ischemia. We will not proceed with further      cardiac workup.  2. Hypertension - his blood pressure is elevated today, but he will      continue on his Lisinopril and this will be tracked by Dr. Renne Crigler.      He may need to have additional medications in the future.  3. Hyperlipidemia - this is being tracked by Dr. Renne Crigler as well.  4. History of goiter - the patient will follow up with Dr. Renne Crigler      concerning this issue as well.   We will see him back on an as needed basis.    Madolyn Frieze Jens Som, MD, [facility]  Electronically  Signed   BSC/MedQ  DD: 10/21/2006  DT: 10/22/2006  Job #: [id number]   cc:   Soyla Murphy. Renne Crigler, M.D.

## 2010-07-08 NOTE — H&P (Signed)
NAME:  Gerald Morales, Gerald Morales              ACCOUNT NO.:  [id number]   MEDICAL RECORD NO.:  [id number]          PATIENT TYPE:  INP   LOCATION:  1434                         FACILITY:  [facility]   PHYSICIAN:  Altha Harm, MDDATE OF BIRTH:  1950/08/23   DATE OF ADMISSION:  10/04/2006  DATE OF DISCHARGE:                              HISTORY & PHYSICAL   CHIEF COMPLAINT:  Pain in the right upper back radiating towards the  right chest wall anteriorly.   HISTORY OF PRESENT ILLNESS:  This is a 60 year old gentleman with a  history of hypertension and hyperlipidemia who presents to the emergency  room with complaint of pain starting in the right upper quadrant,  through to the back, and radiating toward the right arm and right  anterior chest wall.  The patient states that the pain started as a  sharp pain, radiating in the pattern described.  He rates the intensity  of the pain as 10/10 when it initially occurred and states that the pain  has been intermittent.  The patient describes no left-sided chest pain.  He denies any shortness of breath, any dizziness, any loss of  consciousness, any diaphoresis, any nausea, vomiting, diarrhea, or any  fever or chills.  The patient cannot identify any palliative or  provocative features associated with the pain.  The patient was seated  reclining in a chair when the pain occurred and states that the pain was  relieved only with pain medications given here in the emergency room.   PAST MEDICAL HISTORY:  Significant for:  1. Hypertension.  2. Hyperlipidemia.   FAMILY HISTORY:  Significant for diabetes, type 2, and congestive heart  failure.   SOCIAL HISTORY:  The patient denies any alcohol or drug use and states  that he very occasionally smokes cigars.  The patient works for a  [employer] doing active work.   CURRENT MEDICATIONS:  The patient does not know the names of his  medications but states that he takes medication to control high  blood  pressure and to correct hyperlipidemia.   ALLERGIES:  No known drug allergies.   PRIMARY CARE PHYSICIAN:  Soyla Murphy. Renne Crigler, M.D.   REVIEW OF SYSTEMS:  Fourteen systems reviewed.  All systems are negative  except as noted in the HPI.   EMERGENCY DEPARTMENT COURSE:  In the emergency room, the patient had a  CT angiogram of the chest which showed no PE and no dissection of the  thoracic aorta.  The CT did show goiter which was pushing the trachea to  the left.   Hemogram showed a white blood cell count of 6.0, hemoglobin 16.8,  platelet count 260.  Metabolic panel shows sodium 135, potassium 3.7,  chloride 107, bicarb 23, BUN 15, creatinine 0.95.  Point-of-care markers  show CK-MB 1.8, troponin less than 0.05.   A 12-lead EKG shows normal sinus rhythm.   Urinalysis was done and negative for elements associated with urinary  tract infection.   PHYSICAL EXAMINATION:  GENERAL:  The patient is resting comfortably in  bed in no acute distress.  VITAL SIGNS:  Initial blood  pressure was 175/115; however, the patient's  blood pressure has now decreased to 149/74.  He has a heart rate of 64,  respiratory rate 16.  His oral temperature is 98.1.  HEENT:  Normocephalic and atraumatic.  Pupils equal, round, and reactive  to light and accommodation.  Extraocular movements intact.  There is no  icterus or conjunctival pallor.  Oropharynx moist with no exudates,  erythema, or lesions noted.  NECK:  Clinically, the trachea appears to be midline. No mass,  thyromegaly, JVD, or carotid bruit.  LUNGS:  The patient has normal respiratory effort an equal excursion  bilaterally.  HEART:  PMI is not displaced.  No heaves or thrills on palpation.  ABDOMEN:  Normoactive bowel sounds.  Abdomen is soft, nontender,  nondistended.  No masses, no hepatosplenomegaly.  LYMPH NODE SURVEY:  No cervical, axillary, inguinal lymphadenopathy  noted.  MUSCULOSKELETAL:  Normal range of motion throughout upper  and lower  extremities.  There is no warmth, swelling, erythema around his joints.  NEUROLOGIC:  The patient has no focal neurological deficits.  Cranial  nerves II-XII grossly intact.  PSYCHIATRIC:  Alert and oriented x3.  Good insight and cognition.  Good  recent and remote recall.   ASSESSMENT:  The patient presents with  1. Atypical chest pain.  2. Hypertension, uncontrolled.   PLAN:  1. The patient's chest pain is atypical, and I doubt very much that      this represents cardiac pain for the patient.  However, given the      patient's risk factors of age, gender, hyperlipidemia, and      hypertension, it is prudent this patient be ruled out for acute      coronary syndrome.  We will cycle enzymes to ensure he does not      have damage to the myocardium.  2. In terms of high blood pressure, the patient's blood pressure      resolved without any pharmacological intervention, and will monitor      the patient's blood pressure, and I will put him on metoprolol 50      mg p.o. b.i.d. with parameters.  The patient is to get the names of      his medications today so that we can restart him on those      medications.  3. We will get a fasting lipid profile on this patient to assess his      lipid control.  4. CT scan shows the patient may have a goiter, and I will check a TSH      to see if, in fact, there is any active thyroid dysfunction.  The      patient is otherwise stable and will be admitted to a telemetry bed      at this time.      Altha Harm, MD  Electronically Signed     MAM/MEDQ  D:  10/04/2006  T:  10/04/2006  Job:  [id number]   cc:   Soyla Murphy. Renne Crigler, M.D.  Fax: [phone]

## 2010-07-09 ENCOUNTER — Ambulatory Visit (HOSPITAL_BASED_OUTPATIENT_CLINIC_OR_DEPARTMENT_OTHER)
Admission: RE | Admit: 2010-07-09 | Discharge: 2010-07-09 | Disposition: A | Discharge status: Home / Self Care | Payer: BC Managed Care – PPO | Source: Ambulatory Visit | Attending: Surgery | Admitting: Surgery

## 2010-07-09 DIAGNOSIS — K429 Umbilical hernia without obstruction or gangrene: Secondary | ICD-10-CM | POA: Insufficient documentation

## 2010-07-09 DIAGNOSIS — Z01810 Encounter for preprocedural cardiovascular examination: Secondary | ICD-10-CM | POA: Insufficient documentation

## 2010-07-09 DIAGNOSIS — Z01812 Encounter for preprocedural laboratory examination: Secondary | ICD-10-CM | POA: Insufficient documentation

## 2010-07-09 DIAGNOSIS — I10 Essential (primary) hypertension: Secondary | ICD-10-CM | POA: Insufficient documentation

## 2010-07-09 DIAGNOSIS — Z01818 Encounter for other preprocedural examination: Secondary | ICD-10-CM | POA: Insufficient documentation

## 2010-07-10 NOTE — Op Note (Signed)
  NAME:  Gerald Morales, Gerald Morales              ACCOUNT NO.:  [id number]  MEDICAL RECORD NO.:  [id number]           PATIENT TYPE:  E  LOCATION:  WLED                         [facility]ACILITY:  [facility]  PHYSICIAN:  Thornton Park. Daphine Deutscher, MD  DATE OF BIRTH:  1950-12-02  DATE OF PROCEDURE: DATE OF DISCHARGE:  05/05/2010                              OPERATIVE REPORT   PREOPERATIVE DIAGNOSIS:  Umbilical hernia.  POSTOPERATIVE DIAGNOSIS:  Small umbilical hernia.  PROCEDURE:  Repair of umbilical hernia with 4-cm round Proceed mesh.  DESCRIPTION OF PROCEDURE:  A curvilinear incision was made underneath the umbilicus after the patient was prepped with PCMX and draped sterilely.  Also appropriate time-out was performed.  The skin of the umbilicus was dissected off the hernia sac completely and then I was able to then get completely around the hernia and delineate the fascia. This was a properitoneal hernia and I reduced it and put my finger in to sweep around.  In doing so, I created a space there for the Proceed plug.  I then inserted the 4-cm plug patch and it deployed.  Realizing this was a properitoneal position, it may not deploy quite as smoothly as it does when it is completely intraperitoneal, but that being said, it deployed nicely and I went ahead and put 6 sutures to affix it to the surrounding fascia.  I then used a U-stitch to try to approximate the edges of the fascia to completely obliterate the defect.  I tacked the skin down with a 4-0 Vicryl and then closed with interrupted 4-0 Vicryl with Benzoin and Steri-Strips.  He was given a prescription for Vicodin.  I will see him back in the office in about 3 weeks.     Thornton Park Daphine Deutscher, MD     MBM/MEDQ  D:  07/09/2010  T:  07/10/2010  Job:  [id number]  cc:   Soyla Murphy. Renne Crigler, M.D.  Electronically Signed by Luretha Murphy MD on 07/10/2010 07:06:09 AM

## 2010-07-14 ENCOUNTER — Ambulatory Visit
Admission: RE | Admit: 2010-07-14 | Discharge: 2010-07-14 | Disposition: A | Discharge status: Home / Self Care | Payer: BC Managed Care – PPO | Source: Ambulatory Visit | Attending: Internal Medicine | Admitting: Internal Medicine

## 2010-07-14 DIAGNOSIS — R221 Localized swelling, mass and lump, neck: Secondary | ICD-10-CM

## 2010-10-07 ENCOUNTER — Ambulatory Visit
Admission: RE | Admit: 2010-10-07 | Discharge: 2010-10-07 | Disposition: A | Discharge status: Home / Self Care | Payer: BC Managed Care – PPO | Source: Ambulatory Visit | Attending: Internal Medicine | Admitting: Internal Medicine

## 2010-10-07 ENCOUNTER — Other Ambulatory Visit: Payer: Self-pay | Admitting: Internal Medicine

## 2010-10-07 DIAGNOSIS — R197 Diarrhea, unspecified: Secondary | ICD-10-CM

## 2010-10-07 DIAGNOSIS — R1032 Left lower quadrant pain: Secondary | ICD-10-CM

## 2010-10-07 DIAGNOSIS — R509 Fever, unspecified: Secondary | ICD-10-CM

## 2010-10-07 MED ORDER — IOHEXOL 300 MG/ML  SOLN: 125.0000 mL | Freq: Once | INTRAMUSCULAR | Status: AC | PRN

## 2010-10-21 ENCOUNTER — Ambulatory Visit (HOSPITAL_BASED_OUTPATIENT_CLINIC_OR_DEPARTMENT_OTHER)
Admission: RE | Admit: 2010-10-21 | Discharge: 2010-10-21 | Disposition: A | Discharge status: Home / Self Care | Payer: BC Managed Care – PPO | Source: Ambulatory Visit | Attending: Urology | Admitting: Urology

## 2010-10-21 ENCOUNTER — Other Ambulatory Visit: Payer: Self-pay | Admitting: Urology

## 2010-10-21 DIAGNOSIS — C672 Malignant neoplasm of lateral wall of bladder: Secondary | ICD-10-CM | POA: Insufficient documentation

## 2010-10-21 DIAGNOSIS — Z01812 Encounter for preprocedural laboratory examination: Secondary | ICD-10-CM | POA: Insufficient documentation

## 2010-10-21 LAB — POCT I-STAT 4, (NA,K, GLUC, HGB,HCT): Glucose, Bld: 100 mg/dL — ABNORMAL HIGH (ref 70–99)

## 2010-10-28 NOTE — Op Note (Signed)
NAME:  Gerald Morales, Gerald Morales NO.:  [id number]  MEDICAL RECORD NO.:  [id number]  LOCATION:  WLED                         FACILITY:  [facility]  PHYSICIAN:  Natalia Leatherwood, MD    DATE OF BIRTH:  Oct 18, 1950  DATE OF PROCEDURE:  10/21/2010 DATE OF DISCHARGE:                              OPERATIVE REPORT   PREOPERATIVE DIAGNOSIS:  Bladder tumor.  POSTOPERATIVE DIAGNOSIS:  Bladder tumor.  PROCEDURES PERFORMED:  Cystoscopy, bilateral retrograde pyelogram, transurethral resection of bladder tumor larger than 0.5 cm, smaller than 2 cm, prostatic urethral biopsy.  BLOOD LOSS:  Less than 20 mL.  ANESTHESIA:  General.  SPECIMEN:  Bladder tumor sent for permanent pathology, prostatic urethra biopsy sent for permanent pathology as a separate specimen.  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  COMPLICATIONS:  None.  HISTORY OF PRESENT ILLNESS:  This is a pleasant gentleman, who presented to the emergency department where a CT scan was obtained and revealed a bladder tumor.  He saw me in the office and I recommended proceeding to the operating room for transurethral resection of bladder tumor and bilateral retrograde pyelograms.  The patient presented for the procedure today.  PROCEDURE:  After informed consent was obtained, the patient was taken to the operating room, where he was placed in supine position, IV antibiotics were infused and general anesthesia was induced.  He was then placed in a dorsal lithotomy position where his pertinent neurovascular pressure points were padded appropriately.  His genitals were then prepped and draped in the usual sterile fashion and a 12- degree cystoscope was advanced through the urethra and to the bladder. The bladder was evaluated with a 12-degree and a 70-degree lens in the systematic fashion.  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 Bilateral retrograde pyelograms were obtained by cannulating each ureteral orifice with a 5-French open-ended Pollack catheter.  There were no filling defects bilaterally.  After this was done, the blind obturator to the resectoscope was placed and then a Gyrus machine was used to resect in normal saline.  Dissection of the bladder tumor was carried out resecting deep enough to obtain what appeared to be muscle tissue.  After this was done, fulguration was carried out at the biopsy site and then all bladder tumor chips were removed and sent for pathology.  Upon removal of the resectoscope, the prostatic urethra also appeared to be red and friable and somewhat concerning; and therefore, a biopsy of the prostatic urethra was taken and sent separately for pathology.  Fulguration of this biopsy site was performed with a Gyrus resectoscope.  After this was done, the bladder was emptied, 10 cc of sterile lidocaine jelly were placed into the urethra, and then the patient had the B and O suppository placed in his rectum.  This completed the procedure.  Anesthesia was reversed, and he was placed back in supine position and taken to the PACU in stable  condition.          ______________________________ Natalia Leatherwood, MD     DW/MEDQ  D:  10/21/2010  T:  10/21/2010  Job:  [id number]  Electronically Signed by Natalia Leatherwood MD on 10/28/2010 07:04:53 PM

## 2010-11-13 LAB — BASIC METABOLIC PANEL
Chloride: 104
GFR calc Af Amer: >60
GFR calc Af Amer: >60
GFR calc non Af Amer: 52 — ABNORMAL LOW
GFR calc non Af Amer: >60
Glucose, Bld: 107 — ABNORMAL HIGH
Potassium: 3.3 — ABNORMAL LOW
Potassium: 4
Sodium: 134 — ABNORMAL LOW
Sodium: 137

## 2010-11-13 LAB — CBC
HCT: 47.2
Hemoglobin: 18.4 — ABNORMAL HIGH
Hemoglobin: 16.3
MCV: 89.5
RBC: 6.02 — ABNORMAL HIGH
RBC: 5.28
WBC: 6.4

## 2010-11-13 LAB — COMPREHENSIVE METABOLIC PANEL WITH GFR
ALT: 31
AST: 31
Albumin: 4.6
Alkaline Phosphatase: 56
BUN: 23
CO2: 22
Calcium: 9.6
Chloride: 104
Creatinine, Ser: 1.49
GFR calc non Af Amer: 48 — ABNORMAL LOW
Glucose, Bld: 162 — ABNORMAL HIGH
Potassium: 3.4 — ABNORMAL LOW
Sodium: 138
Total Bilirubin: 3.1 — ABNORMAL HIGH
Total Protein: 8

## 2010-11-13 LAB — DIFFERENTIAL
Basophils Absolute: 0
Lymphs Abs: 0.2 — ABNORMAL LOW

## 2010-11-13 LAB — TSH: TSH: 0.149 — ABNORMAL LOW

## 2010-11-13 LAB — CLOSTRIDIUM DIFFICILE EIA

## 2010-11-13 LAB — STOOL CULTURE

## 2010-11-13 LAB — LIPASE, BLOOD: Lipase: 15

## 2010-12-08 LAB — BASIC METABOLIC PANEL
Calcium: 8.8
GFR calc Af Amer: >60
GFR calc non Af Amer: >60
Glucose, Bld: 97
Potassium: 3.7
Sodium: 137

## 2010-12-08 LAB — POCT CARDIAC MARKERS
CKMB, poc: 1.6
Myoglobin, poc: 54.7
Myoglobin, poc: 62.1
Operator id: 4533
Operator id: 4533
Troponin i, poc: <0.05

## 2010-12-08 LAB — DIFFERENTIAL
Basophils Absolute: 0
Basophils Relative: 1
Lymphocytes Relative: 32
Monocytes Absolute: 0.4
Monocytes Relative: 6
Neutro Abs: 3.4
Neutrophils Relative %: 56

## 2010-12-08 LAB — COMPREHENSIVE METABOLIC PANEL
Albumin: 4
Alkaline Phosphatase: 50
BUN: 15
Creatinine, Ser: 0.95
Glucose, Bld: 103 — ABNORMAL HIGH
Total Bilirubin: 0.8
Total Protein: 7.2

## 2010-12-08 LAB — APTT: aPTT: 25

## 2010-12-08 LAB — CARDIAC PANEL(CRET KIN+CKTOT+MB+TROPI)
Total CK: 84
Total CK: 84
Troponin I: 0.02

## 2010-12-08 LAB — LIPID PANEL
Cholesterol: 247 — ABNORMAL HIGH
HDL: 36 — ABNORMAL LOW
Total CHOL/HDL Ratio: 6.9
VLDL: 60 — ABNORMAL HIGH

## 2010-12-08 LAB — URINALYSIS, ROUTINE W REFLEX MICROSCOPIC
Bilirubin Urine: NEGATIVE
Ketones, ur: NEGATIVE
Nitrite: NEGATIVE
Protein, ur: NEGATIVE
Urobilinogen, UA: 0.2
pH: 5.5

## 2010-12-08 LAB — CBC
HCT: 47.6
Hemoglobin: 16.8
MCV: 88
Platelets: 216
RDW: 13.2

## 2010-12-08 LAB — PROTIME-INR: INR: 0.9

## 2013-09-05 ENCOUNTER — Other Ambulatory Visit: Payer: Self-pay | Admitting: Gastroenterology

## 2013-10-20 ENCOUNTER — Telehealth: Payer: Self-pay | Admitting: Oncology

## 2013-10-20 NOTE — Telephone Encounter (Signed)
C/D 07/20/13 for appt. 11/03/13

## 2013-10-20 NOTE — Telephone Encounter (Signed)
S/W PATIENT AND GAVE NP APPT FOR 09/11 @ 1:30 W/DR. SHADAD.  REFERRING DR. Deland Pretty DX- PE

## 2013-11-02 ENCOUNTER — Telehealth: Payer: Self-pay | Admitting: Medical Oncology

## 2013-11-02 NOTE — Telephone Encounter (Signed)
Call to patient reminding him of tomorrow's appts with the [facility]. LVMOM asking patient to bring a current list of medications and call office letting us know he received message.

## 2013-11-03 ENCOUNTER — Ambulatory Visit: Payer: BC Managed Care – PPO

## 2013-11-03 ENCOUNTER — Encounter: Payer: Self-pay | Admitting: Oncology

## 2013-11-03 ENCOUNTER — Telehealth: Payer: Self-pay | Admitting: Oncology

## 2013-11-03 ENCOUNTER — Other Ambulatory Visit: Payer: BC Managed Care – PPO

## 2013-11-03 ENCOUNTER — Ambulatory Visit (HOSPITAL_BASED_OUTPATIENT_CLINIC_OR_DEPARTMENT_OTHER): Payer: BC Managed Care – PPO | Admitting: Oncology

## 2013-11-03 VITALS — BP 130/72 | HR 64 | Temp 97.9°F | Resp 18 | Wt 239.2 lb

## 2013-11-03 DIAGNOSIS — E669 Obesity, unspecified: Secondary | ICD-10-CM

## 2013-11-03 DIAGNOSIS — I2699 Other pulmonary embolism without acute cor pulmonale: Secondary | ICD-10-CM

## 2013-11-03 DIAGNOSIS — I10 Essential (primary) hypertension: Secondary | ICD-10-CM

## 2013-11-03 DIAGNOSIS — E785 Hyperlipidemia, unspecified: Secondary | ICD-10-CM

## 2013-11-03 NOTE — Progress Notes (Signed)
Checked in new pt with no financial concerns. °

## 2013-11-03 NOTE — Progress Notes (Signed)
Please see consult note.  

## 2013-11-03 NOTE — Telephone Encounter (Signed)
gv adn printed appt sched and avs for pt for Feb adn march 2016...Marland KitchenMarland Kitchen

## 2013-11-03 NOTE — Consult Note (Signed)
Reason for Referral: Pulmonary embolism.   HPI: 63 year old gentleman currently of [place] where he lives the majority of his life. Is a gentleman with a history of hypertension hyperlipidemia as well as mild obesity and heavy smoking history. He was in his usual state of health to her about late July where he traveled to the beach after a 4 hour drive. While he was in that area, he developed back pain that progressively gotten worse and subsequently started developing chest pain or shortness of breath. He was seen in a local emergency department and [place]. He was diagnosed with a bilateral pulmonary emboli but no DVTs. He was started on Lovenox and subsequently been on warfarin since August 1. Since that time, he has not had any other blood clots and had been doing relatively fair. He is no longer requiring any chest pain or shortness of breath. He is not had any previous blood clots leading up to this. He has not had any recent hospitalization or illnesses. Did not have any provoking symptoms other than his 4 hour drive.   Clinically, he is asymptomatic at this point. It is not reported headaches or vision problems. He did not report any fevers, chills, sweats. He has not reported any weight loss or appetite changes. Has not reported any abdominal pain or early satiety. He had a colonoscopy earlier this year. He has not reported any frequency urgency or hesitancy. Did not report any chest pain or palpitation. Did not report any shortness of breath or cough. Did not report any lymphadenopathy or petechiae. He did not recall any history of phlebitis or any venous problems. This review of systems unremarkable.   Past Medical History  Diagnosis Date  . Hypertension   :  No past surgical history on file.:  Current Outpatient Prescriptions  Medication Sig Dispense Refill  . losartan-hydrochlorothiazide (HYZAAR) 100-25 MG per tablet Take 1 tablet by mouth daily.       . rosuvastatin (CRESTOR)  20 MG tablet Take 20 mg by mouth daily.      Marland Kitchen VIAGRA 100 MG tablet Take by mouth as needed.       . warfarin (COUMADIN) 7.5 MG tablet        No current facility-administered medications for this visit.      No family history of thrombosis.   History   Social History  . Marital Status: Married    Spouse Name: N/A    Number of Children: N/A  . Years of Education: N/A   Occupational History  . Not on file.   Social History Main Topics  . Smoking status: Not on file  . Smokeless tobacco: Not on file  . Alcohol Use: Not on file  . Drug Use: Not on file  . Sexual Activity: Not on file   Other Topics Concern  . Not on file   Social History Narrative  . No narrative on file  :  Pertinent items are noted in HPI.  Exam: ECOG 0 Blood pressure 130/72, pulse 64, temperature 97.9 F (36.6 C), temperature source Oral, resp. rate 18, weight 239 lb 3.2 oz (108.5 kg). General appearance: alert and cooperative Head: Normocephalic, without obvious abnormality Throat: lips, mucosa, and tongue normal; teeth and gums normal Neck: no adenopathy Back: symmetric, no curvature. ROM normal. No CVA tenderness. Resp: clear to auscultation bilaterally Cardio: regular rate and rhythm, S1, S2 normal, no murmur, click, rub or gallop GI: soft, non-tender; bowel sounds normal; no masses,  no organomegaly Extremities:  extremities normal, atraumatic, no cyanosis or edema Pulses: 2+ and symmetric Skin: Skin color, texture, turgor normal. No rashes or lesions    Assessment and Plan:   63 year old gentleman with a history of hypertension obesity and heavy smoking history with an acute bilateral pulmonary emboli diagnosed in July of 2015. Other than a long car wide of 4 hours, there is no clear-cut provoking factors. He did not have any orthopedic surgery or extreme lengthy immobilization. He does not have any clear-cut signs or symptoms of any occult malignancy. He did have a colonoscopy which rules  out colon cancer. An inherited thrombophilia is a possibility but it is less likely given his age and lack of any previous blood clots.  From a management standpoint, he will require at least 6 months of anticoagulation. After that, I will check a hypercoagulable panel to determine whether he needs longer anticoagulation to duration at that time, we will discuss the risks and benefits of continuing anticoagulation versus using aspirin instead.  He will followup in March of 2016 for my final recommendations at that time.

## 2014-02-23 DIAGNOSIS — Z8719 Personal history of other diseases of the digestive system: Secondary | ICD-10-CM

## 2014-02-23 HISTORY — DX: Personal history of other diseases of the digestive system: Z87.19

## 2014-03-30 ENCOUNTER — Telehealth: Payer: Self-pay | Admitting: Oncology

## 2014-03-30 NOTE — Telephone Encounter (Signed)
Pt called left msg to r/s labs, called pt back lft msg confirming new D/T .Marland Kitchen.. KJ

## 2014-04-10 ENCOUNTER — Other Ambulatory Visit: Payer: BC Managed Care – PPO

## 2014-04-12 ENCOUNTER — Ambulatory Visit (HOSPITAL_BASED_OUTPATIENT_CLINIC_OR_DEPARTMENT_OTHER): Payer: BLUE CROSS/BLUE SHIELD

## 2014-04-12 DIAGNOSIS — I2699 Other pulmonary embolism without acute cor pulmonale: Secondary | ICD-10-CM

## 2014-04-16 LAB — HYPERCOAGULABLE PANEL, COMPREHENSIVE
ANTICARDIOLIPIN IGA: 0 U/mL (ref <22)
ANTITHROMB III FUNC: 86 % (ref 76–126)
Anticardiolipin IgG: 7 GPL U/mL (ref <23)
Anticardiolipin IgM: 0 MPL U/mL (ref <11)
BETA-2-GLYCOPROTEIN I IGM: 5 M Units (ref <20)
Beta-2 Glyco I IgG: 7 G Units (ref <20)
Beta-2-Glycoprotein I IgA: 3 A Units (ref <20)
DRVVT 1:1 Mix: 51.8 secs — ABNORMAL HIGH (ref <42.9)
DRVVT: 64.7 s — AB (ref <42.9)
Drvvt confirmation: 1.18 Ratio — ABNORMAL HIGH (ref <1.15)
LUPUS ANTICOAGULANT: DETECTED — AB
PROTEIN C ACTIVITY: 200 % — AB (ref 75–133)
PTT LA: 42.1 s (ref 28.0–43.0)
Protein C, Total: 133 % (ref 72–160)
Protein S Activity: 200 % — ABNORMAL HIGH (ref 69–129)
Protein S Total: 109 % (ref 60–150)

## 2014-04-20 LAB — HEXAGONAL PHOSPHOLIPID NEUTRALIZATION: HEX PHOSPH NEUT TEST: NEGATIVE

## 2014-04-24 ENCOUNTER — Telehealth: Payer: Self-pay | Admitting: Oncology

## 2014-04-24 ENCOUNTER — Ambulatory Visit (HOSPITAL_BASED_OUTPATIENT_CLINIC_OR_DEPARTMENT_OTHER): Payer: BLUE CROSS/BLUE SHIELD | Admitting: Oncology

## 2014-04-24 VITALS — BP 135/83 | HR 82 | Temp 97.6°F | Resp 18 | Wt 246.1 lb

## 2014-04-24 DIAGNOSIS — I2699 Other pulmonary embolism without acute cor pulmonale: Secondary | ICD-10-CM

## 2014-04-24 DIAGNOSIS — D6862 Lupus anticoagulant syndrome: Secondary | ICD-10-CM

## 2014-04-24 DIAGNOSIS — I82409 Acute embolism and thrombosis of unspecified deep veins of unspecified lower extremity: Secondary | ICD-10-CM

## 2014-04-24 NOTE — Telephone Encounter (Signed)
gv and printed appts sched and avs for pt for SEpt.Marland KitchenMarland KitchenMarland Kitchen

## 2014-04-24 NOTE — Progress Notes (Signed)
Hematology and Oncology Follow Up Visit  Gerald Morales [id number] 1950/10/21 64 y.o. 04/24/2014 1:40 PM Gerald Morales, MDPharr, Leonidas Romberg,*   Principle Diagnosis: 64 year old gentleman with bilateral PE diagnosed in July 2015. His blood clot was provoked by potentially long travel in a 4 hour drive. 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   Prior Therapy: He was initially treated with warfarin but INR level was difficult to control.  Current therapy: Xarelto 20 mg daily. He has been on full dose anticoagulation since 09/23/2013.  Interim History: Gerald Morales presents today for a follow-up visit. Since the last visit, he was switched from warfarin to Xarelto for better management. His INR levels were fluctuating between subtherapeutic and supratherapeutic. He is doing well on Xarelto without any complications. He has not reported any bleeding or thrombosis episodes. He does not report any chest pain, shortness of breath or dyspnea on exertion. He does not report any headaches or vision problems. He did not report any fevers, chills, sweats. He has not reported any weight loss or appetite changes. Has not reported any abdominal pain or early satiety. He had a colonoscopy earlier this year. He has not reported any frequency urgency or hesitancy. Did not report any chest pain or palpitation. Did not report any shortness of breath or cough. Did not report any lymphadenopathy or petechiae. He did not recall any history of phlebitis or any venous problems. This review of systems unremarkable.   Medications: I have reviewed the patient's current medications.  Current Outpatient Prescriptions  Medication Sig Dispense Refill  . losartan-hydrochlorothiazide (HYZAAR) 100-25 MG per tablet Take 1 tablet by mouth daily.     . rosuvastatin (CRESTOR) 20 MG tablet Take 20 mg by mouth daily.    Marland Kitchen VIAGRA 100 MG tablet Take by mouth as needed.      No current facility-administered  medications for this visit.     Allergies: Not on File  Past Medical History, Surgical history, Social history, and Family History were reviewed and updated.   Physical Exam: Blood pressure 135/83, pulse 82, temperature 97.6 F (36.4 C), temperature source Oral, resp. rate 18, weight 246 lb 1.6 oz (111.63 kg), SpO2 96 %. ECOG: 0 General appearance: alert and cooperative Head: Normocephalic, without obvious abnormality Neck: no adenopathy Lymph nodes: Cervical, supraclavicular, and axillary nodes normal. Heart:regular rate and rhythm, S1, S2 normal, no murmur, click, rub or gallop Lung:chest clear, no wheezing, rales, normal symmetric air entry Abdomin: soft, non-tender, without masses or organomegaly EXT:no erythema, induration, or nodules   Lab Results: Lab Results  Component Value Date   WBC 6.4 03/15/2007   HGB 16.3 10/21/2010   HCT 48.0 10/21/2010   MCV 89.5 03/15/2007   PLT 239 03/15/2007     Chemistry      Component Value Date/Time   NA 138 10/21/2010 1021   K 3.4* 10/21/2010 1021   CL 104 07/04/2010 1545   CO2 25 03/15/2007 0925   BUN 16 07/04/2010 1545   CREATININE 1.30 07/04/2010 1545      Component Value Date/Time   CALCIUM 9.5 03/15/2007 0925   ALKPHOS 56 03/02/2007 0852   AST 31 03/02/2007 0852   ALT 31 03/02/2007 0852   BILITOT 3.1* 03/02/2007 0852      Results for Gerald Morales (MRN [id number]) as of 04/24/2014 13:47  Ref. Range 04/12/2014 14:06  Lupus Anticoagulant Latest Range: NOT DETECTED  DETECTED (A)    Impression and Plan:  64 year old gentleman with the following issues:  1. Bilateral pulmonary emboli diagnosed in July 2015. He does have protruding symptoms that includes obesity, smoking and long car ride. His hypercoagulable workup obtained on February 2016 was reviewed today and showed a positive lupus anticoagulant. The implication of these findings were discussed with the patient today extensively. Given these findings, I  recommended continuing long-term anticoagulation with Xarelto as long as this test remain positive. The etiology of lupus anticoagulant was discussed and this could be acquired findings and certainly can subside in the future. But for the time being, he is at high risk of developing other thrombosis episodes and I have recommended continuing long-term anticoagulation. I plan on repeating Lupus anticoagulant testing in 6 months and having another conversation at that time. At this time, the risk of thrombosis outweighs the potential complications from Xarelto that include bleeding complications. He is agreeable to continue at this time.  2. Age-appropriate cancer screening: He is up-to-date at this time.  3. Follow-up: it will be in 6 months after a repeat lupus anticoagulant testing.  Zola Button, MD 3/1/20161:40 PM

## 2014-06-01 ENCOUNTER — Ambulatory Visit (INDEPENDENT_AMBULATORY_CARE_PROVIDER_SITE_OTHER): Payer: BLUE CROSS/BLUE SHIELD | Admitting: Internal Medicine

## 2014-06-01 ENCOUNTER — Ambulatory Visit (INDEPENDENT_AMBULATORY_CARE_PROVIDER_SITE_OTHER): Payer: BLUE CROSS/BLUE SHIELD

## 2014-06-01 VITALS — BP 128/84 | HR 87 | Temp 97.5°F | Resp 16 | Ht 71.0 in | Wt 240.0 lb

## 2014-06-01 DIAGNOSIS — R1030 Lower abdominal pain, unspecified: Secondary | ICD-10-CM | POA: Diagnosis not present

## 2014-06-01 DIAGNOSIS — K5732 Diverticulitis of large intestine without perforation or abscess without bleeding: Secondary | ICD-10-CM

## 2014-06-01 LAB — COMPLETE METABOLIC PANEL WITH GFR
ALT: 34 U/L (ref 0–53)
AST: 19 U/L (ref 0–37)
Albumin: 4.2 g/dL (ref 3.5–5.2)
Alkaline Phosphatase: 68 U/L (ref 39–117)
BILIRUBIN TOTAL: 1.7 mg/dL — AB (ref 0.2–1.2)
BUN: 18 mg/dL (ref 6–23)
CHLORIDE: 95 meq/L — AB (ref 96–112)
CO2: 29 meq/L (ref 19–32)
CREATININE: 1.23 mg/dL (ref 0.50–1.35)
Calcium: 9.8 mg/dL (ref 8.4–10.5)
GFR, Est African American: 70 mL/min
GFR, Est Non African American: 61 mL/min
Glucose, Bld: 100 mg/dL — ABNORMAL HIGH (ref 70–99)
Potassium: 3.8 mEq/L (ref 3.5–5.3)
SODIUM: 136 meq/L (ref 135–145)
TOTAL PROTEIN: 7.7 g/dL (ref 6.0–8.3)

## 2014-06-01 LAB — POCT URINALYSIS DIPSTICK
Bilirubin, UA: NEGATIVE
Glucose, UA: NEGATIVE
Ketones, UA: NEGATIVE
Leukocytes, UA: NEGATIVE
Nitrite, UA: NEGATIVE
PROTEIN UA: NEGATIVE
Spec Grav, UA: 1.015
UROBILINOGEN UA: 1
pH, UA: 6

## 2014-06-01 LAB — POCT UA - MICROSCOPIC ONLY
Bacteria, U Microscopic: NEGATIVE
Casts, Ur, LPF, POC: NEGATIVE
Crystals, Ur, HPF, POC: NEGATIVE
EPITHELIAL CELLS, URINE PER MICROSCOPY: NEGATIVE
Yeast, UA: NEGATIVE

## 2014-06-01 LAB — POCT CBC
Granulocyte percent: 88.3 %G — AB (ref 37–80)
HEMATOCRIT: 49.4 % (ref 43.5–53.7)
HEMOGLOBIN: 16.3 g/dL (ref 14.1–18.1)
Lymph, poc: 1.4 (ref 0.6–3.4)
MCH: 29 pg (ref 27–31.2)
MCHC: 33.1 g/dL (ref 31.8–35.4)
MCV: 87.8 fL (ref 80–97)
MID (cbc): 0.4 (ref 0–0.9)
MPV: 6.8 fL (ref 0–99.8)
POC Granulocyte: 13.3 — AB (ref 2–6.9)
POC LYMPH PERCENT: 9.2 %L — AB (ref 10–50)
POC MID %: 2.5 % (ref 0–12)
Platelet Count, POC: 376 10*3/uL (ref 142–424)
RBC: 5.63 M/uL (ref 4.69–6.13)
RDW, POC: 13.3 %
WBC: 15.1 10*3/uL — AB (ref 4.6–10.2)

## 2014-06-01 LAB — POCT SEDIMENTATION RATE: POCT SED RATE: 83 mm/h — AB (ref 0–22)

## 2014-06-01 LAB — IFOBT (OCCULT BLOOD): IFOBT: NEGATIVE

## 2014-06-01 LAB — LIPASE: Lipase: 21 U/L (ref 0–75)

## 2014-06-01 LAB — GLUCOSE, POCT (MANUAL RESULT ENTRY): POC Glucose: 101 mg/dl — AB (ref 70–99)

## 2014-06-01 MED ORDER — HYDROCODONE-ACETAMINOPHEN 5-325 MG PO TABS: 1.0000 | ORAL_TABLET | Freq: Four times a day (QID) | ORAL | Status: DC | PRN

## 2014-06-01 MED ORDER — CEFTRIAXONE SODIUM 1 G IJ SOLR
1.0000 g | Freq: Once | INTRAMUSCULAR | Status: AC
  Administered 2014-06-01: 1 g via INTRAMUSCULAR

## 2014-06-01 MED ORDER — CIPROFLOXACIN HCL 500 MG PO TABS: 500.0000 mg | ORAL_TABLET | Freq: Two times a day (BID) | ORAL | Status: DC

## 2014-06-01 MED ORDER — METRONIDAZOLE 250 MG PO TABS: 250.0000 mg | ORAL_TABLET | Freq: Three times a day (TID) | ORAL | Status: DC

## 2014-06-01 NOTE — Progress Notes (Signed)
Subjective:    Patient ID: Gerald Morales, male    DOB: 05-14-1950, 64 y.o.   MRN: [id number]  HPI I, Grant Fontana CMA and scribing for Dr. Lou Miner /edited by Jahniyah Revere,me.  64 y.o. Male presents to clinic today with lower abdominal pain and pressure with urination. Denies any dysuria. Denies have any fever of chills currently. Denies any night sweats, reports that pain wakes him at night. Denies any discharge from penis. Reports that bowels are loose, no nausea, no vomiting. States that he has had 3 bowel movements today. Denies any history of diabetes. Had a urine sample and xray done at PCP last Thursday. Reports that xray was negative and no signs of urinary infection. Saw his doctor 1 week ago for this pain, Dr. Shelia Media. Dxed constipation and started miralax and an enema and had above success. No pain with walking or jarring.Pain is lower abdomen right, left and middle. No fatigue, anorexia, chills. No history of liver problems of pancreatitis. No blood seen. Colonoscopy last year: reports they didn't fine anything, was told he had diverticulitits.  Hx. Of diverticulitis x 2 years ago, had CT scan done. No loss of appetite.   Review of Systems     Objective:   Physical Exam  Constitutional: He is oriented to person, place, and time. He appears well-nourished. No distress.  HENT:  Head: Normocephalic.  Right Ear: External ear normal.  Left Ear: External ear normal.  Mouth/Throat: Oropharynx is clear and moist.  Eyes: EOM are normal. No scleral icterus.  Neck: Normal range of motion.  Cardiovascular: Normal rate, regular rhythm and normal heart sounds.   Pulmonary/Chest: Effort normal and breath sounds normal. He exhibits no tenderness.  Abdominal: Soft. Normal appearance and bowel sounds are normal. He exhibits no distension and no mass. There is no hepatosplenomegaly. There is tenderness in the right lower quadrant, suprapubic area and left lower quadrant. There is rebound. There is  no rigidity, no guarding and no CVA tenderness. No hernia. Hernia confirmed negative in the right inguinal area and confirmed negative in the left inguinal area.    Tender lower quads with rebound  Jump test negative  Genitourinary: Rectum normal and penis normal. Prostate is enlarged. Prostate is not tender. Right testis shows mass. Right testis shows no swelling and no tenderness. Right testis is descended. Cremasteric reflex is absent on the right side. Left testis shows no mass, no swelling and no tenderness. Left testis is descended. Cremasteric reflex is not absent on the left side. Uncircumcised.  prostate symmetric enlargement no nodules, not tender  Lymphadenopathy:       Right: No inguinal adenopathy present.       Left: No inguinal adenopathy present.  Neurological: He is alert and oriented to person, place, and time. He exhibits normal muscle tone. Coordination normal.  Skin: No rash noted.  Psychiatric: He has a normal mood and affect. His behavior is normal.      [facility] reading (PRIMARY) by  Dr.Jayron Maqueda 15,000 wbc, no free air, no obstruction, does airfluid levels, ileus  Results for orders placed or performed in visit on 06/01/14  POCT CBC  Result Value Ref Range   WBC 15.1 (A) 4.6 - 10.2 K/uL   Lymph, poc 1.4 0.6 - 3.4   POC LYMPH PERCENT 9.2 (A) 10 - 50 %L   MID (cbc) 0.4 0 - 0.9   POC MID % 2.5 0 - 12 %M   POC Granulocyte 13.3 (A) 2 - 6.9  Granulocyte percent 88.3 (A) 37 - 80 %G   RBC 5.63 4.69 - 6.13 M/uL   Hemoglobin 16.3 14.1 - 18.1 g/dL   HCT, POC 49.4 43.5 - 53.7 %   MCV 87.8 80 - 97 fL   MCH, POC 29.0 27 - 31.2 pg   MCHC 33.1 31.8 - 35.4 g/dL   RDW, POC 13.3 %   Platelet Count, POC 376 142 - 424 K/uL   MPV 6.8 0 - 99.8 fL  POCT glucose (manual entry)  Result Value Ref Range   POC Glucose 101 (A) 70 - 99 mg/dl  POCT urinalysis dipstick  Result Value Ref Range   Color, UA yellow    Clarity, UA clear    Glucose, UA neg    Bilirubin, UA neg    Ketones,  UA neg    Spec Grav, UA 1.015    Blood, UA small    pH, UA 6.0    Protein, UA neg    Urobilinogen, UA 1.0    Nitrite, UA neg    Leukocytes, UA Negative   POCT UA - Microscopic Only  Result Value Ref Range   WBC, Ur, HPF, POC 0-2    RBC, urine, microscopic 0-2    Bacteria, U Microscopic neg    Mucus, UA small    Epithelial cells, urine per micros neg    Crystals, Ur, HPF, POC neg    Casts, Ur, LPF, POC neg    Yeast, UA neg   IFOBT POC (occult bld, rslt in office)  Result Value Ref Range   IFOBT Negative    Sed rate 83!!!    Assessment & Plan:  Possible diverticulitis/Probable Rocephin 1g/cipro and flagyl po F/up tomorrow/if no better CT scan Tylenol for pain/HC if severe and go to er He feels good, surprisingly/Told him must rest and rtc tomorrow.

## 2014-06-01 NOTE — Patient Instructions (Addendum)
Constipation  Constipation is when a person has fewer than three bowel movements a week, has difficulty having a bowel movement, or has stools that are dry, hard, or larger than normal. As people grow older, constipation is more common. If you try to fix constipation with medicines that make you have a bowel movement (laxatives), the problem may get worse. Long-term laxative use may cause the muscles of the colon to become weak. A low-fiber diet, not taking in enough fluids, and taking certain medicines may make constipation worse.   CAUSES   · Certain medicines, such as antidepressants, pain medicine, iron supplements, antacids, and water pills.    · Certain diseases, such as diabetes, irritable bowel syndrome (IBS), thyroid disease, or depression.    · Not drinking enough water.    · Not eating enough fiber-rich foods.    · Stress or travel.    · Lack of physical activity or exercise.    · Ignoring the urge to have a bowel movement.    · Using laxatives too much.    SIGNS AND SYMPTOMS   · Having fewer than three bowel movements a week.    · Straining to have a bowel movement.    · Having stools that are hard, dry, or larger than normal.    · Feeling full or bloated.    · Pain in the lower abdomen.    · Not feeling relief after having a bowel movement.    DIAGNOSIS   Your health care provider will take a medical history and perform a physical exam. Further testing may be done for severe constipation. Some tests may include:  · A barium enema X-ray to examine your rectum, colon, and, sometimes, your small intestine.    · A sigmoidoscopy to examine your lower colon.    · A colonoscopy to examine your entire colon.  TREATMENT   Treatment will depend on the severity of your constipation and what is causing it. Some dietary treatments include drinking more fluids and eating more fiber-rich foods. Lifestyle treatments may include regular exercise. If these diet and lifestyle recommendations do not help, your health care  provider may recommend taking over-the-counter laxative medicines to help you have bowel movements. Prescription medicines may be prescribed if over-the-counter medicines do not work.   HOME CARE INSTRUCTIONS   · Eat foods that have a lot of fiber, such as fruits, vegetables, whole grains, and beans.  · Limit foods high in fat and processed sugars, such as french fries, hamburgers, cookies, candies, and soda.    · A fiber supplement may be added to your diet if you cannot get enough fiber from foods.    · Drink enough fluids to keep your urine clear or pale yellow.    · Exercise regularly or as directed by your health care provider.    · Go to the restroom when you have the urge to go. Do not hold it.    · Only take over-the-counter or prescription medicines as directed by your health care provider. Do not take other medicines for constipation without talking to your health care provider first.    SEEK IMMEDIATE MEDICAL CARE IF:   · You have bright red blood in your stool.    · Your constipation lasts for more than 4 days or gets worse.    · You have abdominal or rectal pain.    · You have thin, pencil-like stools.    · You have unexplained weight loss.  MAKE SURE YOU:   · Understand these instructions.  · Will watch your condition.  · Will get help right away if you are not   you have with your health care provider. Clear Liquid Diet A clear liquid diet is a short-term diet that is prescribed to provide the necessary fluid and basic energy you need when you can have nothing else. The clear liquid diet consists of liquids or solids that will become liquid at room temperature. You should be able to see through the liquid.  There are many reasons that you may be restricted to clear liquids, such as:  When you have a sudden-onset (acute) condition that occurs before or after surgery.  To help your body slowly get adjusted to food again after a long period when you were unable to have food.  Replacement of fluids when you have a diarrheal disease.  When you are going to have certain exams, such as a colonoscopy, in which instruments are inserted inside your body to look at parts of your digestive system. WHAT CAN I HAVE? A clear liquid diet does not provide all the nutrients you need. It is important to choose a variety of the following items to get as many nutrients as possible:  Vegetable juices that do not have pulp.  Fruit juices and fruit drinks that do not have pulp.  Coffee (regular or decaffeinated), tea, or soda at the discretion of your health care provider.  Clear bouillon, broth, or strained broth-based soups.  High-protein and flavored gelatins.  Sugar or honey.  Ices or frozen ice pops that do not contain milk. If you are not sure whether you can have certain items, you should ask your health care provider. You may also ask your health care provider if there are any other clear liquid options. Document Released: 02/09/2005 Document Revised: 02/14/2013 Document Reviewed: 01/06/2013 [facility] Patient Information 2015 [place], [place]. This information is not intended to replace advice given to you by your health care provider. Make sure you discuss any questions you have with your health care provider. Diverticulitis Diverticulitis is inflammation or infection of small pouches in your colon that form when you have a condition called diverticulosis. The pouches in your colon are called diverticula. Your colon, or large intestine, is where water is absorbed and stool is formed. Complications of diverticulitis can include:  Bleeding.  Severe infection.  Severe pain.  Perforation of your  colon.  Obstruction of your colon. CAUSES  Diverticulitis is caused by bacteria. Diverticulitis happens when stool becomes trapped in diverticula. This allows bacteria to grow in the diverticula, which can lead to inflammation and infection. RISK FACTORS People with diverticulosis are at risk for diverticulitis. Eating a diet that does not include enough fiber from fruits and vegetables may make diverticulitis more likely to develop. SYMPTOMS  Symptoms of diverticulitis may include:  Abdominal pain and tenderness. The pain is normally located on the left side of the abdomen, but may occur in other areas.  Fever and chills.  Bloating.  Cramping.  Nausea.  Vomiting.  Constipation.  Diarrhea.  Blood in your stool. DIAGNOSIS  Your health care provider will ask you about your medical history and do a physical exam. You may need to have tests done because many medical conditions can cause the same symptoms as diverticulitis. Tests may include:  Blood tests.  Urine tests.  Imaging tests of the abdomen, including X-rays and CT scans. When your condition is under control, your health care provider may recommend that you have a colonoscopy. A colonoscopy can show how severe your diverticula are and whether something else is causing your symptoms. TREATMENT  Most cases of  diverticulitis are mild and can be treated at home. Treatment may include:  Taking over-the-counter pain medicines.  Following a clear liquid diet.  Taking antibiotic medicines by mouth for 7-10 days. More severe cases may be treated at a hospital. Treatment may include:  Not eating or drinking.  Taking prescription pain medicine.  Receiving antibiotic medicines through an IV tube.  Receiving fluids and nutrition through an IV tube.  Surgery. HOME CARE INSTRUCTIONS   Follow your health care provider's instructions carefully.  Follow a full liquid diet or other diet as directed by your health care  provider. After your symptoms improve, your health care provider may tell you to change your diet. He or she may recommend you eat a high-fiber diet. Fruits and vegetables are good sources of fiber. Fiber makes it easier to pass stool.  Take fiber supplements or probiotics as directed by your health care provider.  Only take medicines as directed by your health care provider.  Keep all your follow-up appointments. SEEK MEDICAL CARE IF:   Your pain does not improve.  You have a hard time eating food.  Your bowel movements do not return to normal. SEEK IMMEDIATE MEDICAL CARE IF:   Your pain becomes worse.  Your symptoms do not get better.  Your symptoms suddenly get worse.  You have a fever.  You have repeated vomiting.  You have bloody or black, tarry stools. MAKE SURE YOU:   Understand these instructions.  Will watch your condition.  Will get help right away if you are not doing well or get worse. Document Released: 11/19/2004 Document Revised: 02/14/2013 Document Reviewed: 01/04/2013 [facility] Patient Information 2015 [place], [place]. This information is not intended to replace advice given to you by your health care provider. Make sure you discuss any questions you have with your health care provider.

## 2014-06-02 ENCOUNTER — Ambulatory Visit (INDEPENDENT_AMBULATORY_CARE_PROVIDER_SITE_OTHER): Payer: BLUE CROSS/BLUE SHIELD | Admitting: Physician Assistant

## 2014-06-02 ENCOUNTER — Encounter (HOSPITAL_COMMUNITY): Payer: Self-pay

## 2014-06-02 ENCOUNTER — Ambulatory Visit (HOSPITAL_COMMUNITY)
Admission: RE | Admit: 2014-06-02 | Discharge: 2014-06-02 | Disposition: A | Discharge status: Home / Self Care | Payer: BLUE CROSS/BLUE SHIELD | Source: Ambulatory Visit | Attending: Physician Assistant | Admitting: Physician Assistant

## 2014-06-02 VITALS — BP 126/82 | HR 84 | Temp 97.5°F | Resp 17 | Ht 71.0 in | Wt 242.0 lb

## 2014-06-02 DIAGNOSIS — R1084 Generalized abdominal pain: Secondary | ICD-10-CM | POA: Diagnosis present

## 2014-06-02 DIAGNOSIS — E785 Hyperlipidemia, unspecified: Secondary | ICD-10-CM | POA: Insufficient documentation

## 2014-06-02 DIAGNOSIS — I10 Essential (primary) hypertension: Secondary | ICD-10-CM | POA: Insufficient documentation

## 2014-06-02 DIAGNOSIS — R109 Unspecified abdominal pain: Secondary | ICD-10-CM

## 2014-06-02 DIAGNOSIS — K760 Fatty (change of) liver, not elsewhere classified: Secondary | ICD-10-CM | POA: Insufficient documentation

## 2014-06-02 DIAGNOSIS — Z86711 Personal history of pulmonary embolism: Secondary | ICD-10-CM | POA: Insufficient documentation

## 2014-06-02 DIAGNOSIS — Z8551 Personal history of malignant neoplasm of bladder: Secondary | ICD-10-CM

## 2014-06-02 DIAGNOSIS — K5732 Diverticulitis of large intestine without perforation or abscess without bleeding: Secondary | ICD-10-CM

## 2014-06-02 DIAGNOSIS — N529 Male erectile dysfunction, unspecified: Secondary | ICD-10-CM | POA: Insufficient documentation

## 2014-06-02 DIAGNOSIS — D72829 Elevated white blood cell count, unspecified: Secondary | ICD-10-CM

## 2014-06-02 LAB — POCT CBC
Granulocyte percent: 89.7 %G — AB (ref 37–80)
HCT, POC: 46.6 % (ref 43.5–53.7)
Hemoglobin: 15.7 g/dL (ref 14.1–18.1)
Lymph, poc: 1.2 (ref 0.6–3.4)
MCH: 29.9 pg (ref 27–31.2)
MCHC: 33.7 g/dL (ref 31.8–35.4)
MCV: 88.8 fL (ref 80–97)
MID (cbc): 0.2 (ref 0–0.9)
MPV: 6.6 fL (ref 0–99.8)
POC Granulocyte: 12.1 — AB (ref 2–6.9)
POC LYMPH PERCENT: 8.6 %L — AB (ref 10–50)
POC MID %: 1.7 % (ref 0–12)
Platelet Count, POC: 330 10*3/uL (ref 142–424)
RBC: 5.25 M/uL (ref 4.69–6.13)
RDW, POC: 13.2 %
WBC: 13.5 10*3/uL — AB (ref 4.6–10.2)

## 2014-06-02 LAB — POCT I-STAT CREATININE: CREATININE: 1.3 mg/dL (ref 0.50–1.35)

## 2014-06-02 MED ORDER — IOHEXOL 300 MG/ML  SOLN
100.0000 mL | Freq: Once | INTRAMUSCULAR | Status: AC | PRN
  Administered 2014-06-02: 100 mL via INTRAVENOUS

## 2014-06-02 MED ORDER — IOHEXOL 300 MG/ML  SOLN
50.0000 mL | Freq: Once | INTRAMUSCULAR | Status: AC | PRN
  Administered 2014-06-02: 50 mL via ORAL

## 2014-06-02 NOTE — Patient Instructions (Signed)
Continue antibiotics. Continue liquid diet. I will call you with the results of your CT.

## 2014-06-02 NOTE — Progress Notes (Signed)
Subjective:    Patient ID: Gerald Morales, male    DOB: 03/19/1950, 64 y.o.   MRN: [id number]  HPI  This is a 64 year old male who is presenting for follow up abdominal pain. He was here yesterday and saw Dr. Elder Cyphers who felt he had possible diverticulitis. WBC count yesterday 15,000. Rocephin 1 gm given in office and was prescribed oral cipro and flagyl. States today he is doing better. The pressure in his abdomen has improved. He now has the pressure only when using the bathroom - having a BM or urinating. He has been on a liquid diet since yesterday and BMs have been watery. He denies fever or chills. Pt is wanting a CT scan today. He reports 3-4 years ago he had diverticulitis and had a CT scan and it was noted incidentally that he had bladder cancer. He had it removed without any problems since. He has a cystoscopy every 6 months. 7 months ago he had PEs of unknown cause. No associated DVT. He reports he has had a cystoscopy since the PEs were found. He feels nervous about his pain and would feel better having a CT done.  Review of Systems  Constitutional: Negative for fever and chills.  Gastrointestinal: Positive for abdominal pain and diarrhea. Negative for nausea, vomiting and blood in stool.  Genitourinary: Negative for dysuria and hematuria.  Skin: Negative for rash.  Hematological: Negative for adenopathy.  Psychiatric/Behavioral: Negative for sleep disturbance.   Patient Active Problem List   Diagnosis Date Noted  . Hx of bladder cancer 06/02/2014  . Hx pulmonary embolism 06/02/2014  . Diverticulitis of colon 06/02/2014  . Erectile dysfunction 06/02/2014  . Essential hypertension 06/02/2014  . Hyperlipidemia 06/02/2014   Prior to Admission medications   Medication Sig Start Date End Date Taking? Authorizing Provider  ciprofloxacin (CIPRO) 500 MG tablet Take 1 tablet (500 mg total) by mouth 2 (two) times daily. 06/01/14  Yes Orma Flaming, MD  losartan-hydrochlorothiazide  (HYZAAR) 100-25 MG per tablet Take 1 tablet by mouth daily.  10/31/13  Yes Historical Provider, MD  metroNIDAZOLE (FLAGYL) 250 MG tablet Take 1 tablet (250 mg total) by mouth 3 (three) times daily. 06/01/14  Yes Orma Flaming, MD  rivaroxaban (XARELTO) 20 MG TABS tablet Take 20 mg by mouth daily with supper.   Yes Historical Provider, MD  rosuvastatin (CRESTOR) 20 MG tablet Take 20 mg by mouth daily.   Yes Historical Provider, MD  HYDROcodone-acetaminophen (NORCO/VICODIN) 5-325 MG per tablet Take 1 tablet by mouth every 6 (six) hours as needed. Patient not taking: Reported on 06/02/2014 06/01/14   Orma Flaming, MD  VIAGRA 100 MG tablet Take by mouth as needed.  08/28/13   Historical Provider, MD    Allergies  Allergen Reactions  . Lipitor [Atorvastatin]   . Vytorin [Ezetimibe-Simvastatin]    Patient's social and family history were reviewed.     Objective:   Physical Exam  Constitutional: He is oriented to person, place, and time. He appears well-developed and well-nourished. No distress.  HENT:  Head: Normocephalic and atraumatic.  Right Ear: Hearing normal.  Left Ear: Hearing normal.  Nose: Nose normal.  Eyes: Conjunctivae and lids are normal. Right eye exhibits no discharge. Left eye exhibits no discharge. No scleral icterus.  Cardiovascular: Normal rate, regular rhythm, normal heart sounds and normal pulses.   No murmur heard. Pulmonary/Chest: Effort normal and breath sounds normal. No respiratory distress.  Abdominal: Soft. Normal appearance. There is tenderness (lower  abdomen).    Musculoskeletal: Normal range of motion.  Neurological: He is alert and oriented to person, place, and time.  Skin: Skin is warm, dry and intact. No lesion and no rash noted.  Psychiatric: He has a normal mood and affect. His speech is normal and behavior is normal. Thought content normal.   BP 126/82 mmHg  Pulse 84  Temp(Src) 97.5 F (36.4 C) (Oral)  Resp 17  Ht 5\' 11"  (1.803 m)  Wt 242 lb (109.77  kg)  BMI 33.77 kg/m2  SpO2 96%  Results for orders placed or performed in visit on 06/02/14  POCT CBC  Result Value Ref Range   WBC 13.5 (A) 4.6 - 10.2 K/uL   Lymph, poc 1.2 0.6 - 3.4   POC LYMPH PERCENT 8.6 (A) 10 - 50 %L   MID (cbc) 0.2 0 - 0.9   POC MID % 1.7 0 - 12 %M   POC Granulocyte 12.1 (A) 2 - 6.9   Granulocyte percent 89.7 (A) 37 - 80 %G   RBC 5.25 4.69 - 6.13 M/uL   Hemoglobin 15.7 14.1 - 18.1 g/dL   HCT, POC 46.6 43.5 - 53.7 %   MCV 88.8 80 - 97 fL   MCH, POC 29.9 27 - 31.2 pg   MCHC 33.7 31.8 - 35.4 g/dL   RDW, POC 13.2 %   Platelet Count, POC 330 142 - 424 K/uL   MPV 6.6 0 - 99.8 fL      Assessment & Plan:  1. Elevated WBC count 2. Abdominal pain 3. Hx bladder cancer 4. diverticulitis Pt's pain is improving however d/t history of bladder cancer he would feel more comfortable having CT done. CT showed diverticulitis of sigmoid colon. WBC has decreased from 15 to 13.5. He will continue with liquid diet for next 2 days and then start to advance diet. He will continue with cipro and flagyl. He will return if not getting better. He will call and let me know how he is doing in 2-3 days.  - POCT CBC - CT Abdomen Pelvis W Contrast; Future    Benjaman Pott. Drenda Freeze, MHS [facility]  06/02/2014

## 2014-06-04 ENCOUNTER — Telehealth: Payer: Self-pay

## 2014-06-04 NOTE — Telephone Encounter (Signed)
Pt states he was put on a liquid diet by Dr.Guest and would like to know how long will he be on it. Please call [phone]

## 2014-06-04 NOTE — Telephone Encounter (Signed)
According to notes, liquid diet for 2 days, then advance diet. lmom to inform pt of this.

## 2014-06-05 ENCOUNTER — Other Ambulatory Visit: Payer: Self-pay | Admitting: Radiology

## 2014-06-05 ENCOUNTER — Telehealth: Payer: Self-pay

## 2014-06-05 NOTE — Telephone Encounter (Signed)
Yes that is fine. I spoke with pt and he is doing much better. No more abdominal pain. He has advanced his diet and is doing well.

## 2014-06-05 NOTE — Telephone Encounter (Signed)
Printed note. Will notify pt.

## 2014-06-05 NOTE — Telephone Encounter (Signed)
Pt states he need a note stating he can go back to work full duty as of today Please call [phone] when ready

## 2014-06-05 NOTE — Telephone Encounter (Signed)
nicole-- Can pt go back full duty?

## 2014-10-25 ENCOUNTER — Ambulatory Visit (HOSPITAL_BASED_OUTPATIENT_CLINIC_OR_DEPARTMENT_OTHER): Payer: BLUE CROSS/BLUE SHIELD

## 2014-10-25 DIAGNOSIS — I82409 Acute embolism and thrombosis of unspecified deep veins of unspecified lower extremity: Secondary | ICD-10-CM

## 2014-10-25 DIAGNOSIS — Z86718 Personal history of other venous thrombosis and embolism: Secondary | ICD-10-CM | POA: Diagnosis not present

## 2014-10-28 LAB — LUPUS ANTICOAGULANT PANEL

## 2014-10-28 LAB — RFX DRVVT SCR W/RFLX CONF 1:1 MIX
DRVVT MIX INTERPRETATION: POSITIVE — AB
DRVVT SCREEN: 91 s — AB (ref <=45)

## 2014-10-28 LAB — RFX PTT-LA W/RFX TO HEX PHASE CONF: PTT-LA SCREEN: 42 s — AB (ref <=40)

## 2014-11-08 ENCOUNTER — Ambulatory Visit: Payer: BLUE CROSS/BLUE SHIELD | Admitting: Oncology

## 2014-11-13 ENCOUNTER — Telehealth: Payer: Self-pay | Admitting: Oncology

## 2014-11-13 NOTE — Telephone Encounter (Signed)
Lft msg for pt confirming MD visit missed in Sept, mailed out schedule. KJ

## 2014-11-15 ENCOUNTER — Telehealth: Payer: Self-pay | Admitting: Oncology

## 2014-11-15 NOTE — Telephone Encounter (Signed)
returned call and lvm for pt with informat of 1st availble on 10.4.Marland KitchenMarland KitchenMarland Kitchenadvised ptto call back if he wanted that appt.

## 2014-11-21 ENCOUNTER — Ambulatory Visit: Payer: BLUE CROSS/BLUE SHIELD | Admitting: Oncology

## 2014-12-05 ENCOUNTER — Ambulatory Visit: Payer: BLUE CROSS/BLUE SHIELD | Admitting: Oncology

## 2014-12-06 ENCOUNTER — Telehealth: Payer: Self-pay | Admitting: Oncology

## 2014-12-06 ENCOUNTER — Ambulatory Visit (HOSPITAL_BASED_OUTPATIENT_CLINIC_OR_DEPARTMENT_OTHER): Payer: BLUE CROSS/BLUE SHIELD | Admitting: Oncology

## 2014-12-06 VITALS — BP 144/84 | HR 76 | Temp 97.7°F | Resp 18 | Ht 71.0 in | Wt 250.6 lb

## 2014-12-06 DIAGNOSIS — Z86711 Personal history of pulmonary embolism: Secondary | ICD-10-CM | POA: Diagnosis not present

## 2014-12-06 DIAGNOSIS — Z7901 Long term (current) use of anticoagulants: Secondary | ICD-10-CM | POA: Diagnosis not present

## 2014-12-06 DIAGNOSIS — D6862 Lupus anticoagulant syndrome: Secondary | ICD-10-CM | POA: Diagnosis not present

## 2014-12-06 NOTE — Progress Notes (Signed)
Hematology and Oncology Follow Up Visit  Gerald Morales [id number] 28-Jul-1950 64 y.o. 12/06/2014 1:21 PM Horatio Pel, MDPharr, Thayer Jew, MD   Principle Diagnosis: 64 year old gentleman with bilateral PE diagnosed in July 2015. His blood clot was provoked by potentially long travel in a 4 hour drive. 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   Prior Therapy: He was initially treated with warfarin but INR level was difficult to control.  Current therapy: Xarelto 20 mg daily. He has been on full dose anticoagulation since 09/23/2013.  Interim History: Mr. Bowland presents today for a follow-up visit. Since the last visit, he continues to do well without any recent complaints. He has not reported any recent thrombosis or bleeding. He is doing well on Xarelto without any complications.  He does not report any chest pain, shortness of breath or dyspnea on exertion. He has no difficulty obtaining this medication and has not reported any any issues with it.  He does not report any headaches or vision problems. He did not report any fevers, chills, sweats. He has not reported any weight loss or appetite changes. Has not reported any abdominal pain or early satiety. He had a colonoscopy earlier this year. He has not reported any frequency urgency or hesitancy. Did not report any chest pain or palpitation. Did not report any shortness of breath or cough. Did not report any lymphadenopathy or petechiae. He did not recall any history of phlebitis or any venous problems. This review of systems unremarkable.   Medications: I have reviewed the patient's current medications.  Current Outpatient Prescriptions  Medication Sig Dispense Refill  . ciprofloxacin (CIPRO) 500 MG tablet Take 1 tablet (500 mg total) by mouth 2 (two) times daily. 20 tablet 0  . HYDROcodone-acetaminophen (NORCO/VICODIN) 5-325 MG per tablet Take 1 tablet by mouth every 6 (six) hours as needed. 30 tablet 0  .  losartan-hydrochlorothiazide (HYZAAR) 100-25 MG per tablet Take 1 tablet by mouth daily.     . metroNIDAZOLE (FLAGYL) 250 MG tablet Take 1 tablet (250 mg total) by mouth 3 (three) times daily. 30 tablet 0  . rivaroxaban (XARELTO) 20 MG TABS tablet Take 20 mg by mouth daily with supper.    . rosuvastatin (CRESTOR) 20 MG tablet Take 20 mg by mouth daily.    Marland Kitchen VIAGRA 100 MG tablet Take by mouth as needed.      No current facility-administered medications for this visit.     Allergies:  Allergies  Allergen Reactions  . Lipitor [Atorvastatin]   . Vytorin [Ezetimibe-Simvastatin]     Past Medical History, Surgical history, Social history, and Family History were reviewed and updated.   Physical Exam: Blood pressure 144/84, pulse 76, temperature 97.7 F (36.5 C), temperature source Oral, resp. rate 18, height 5\' 11"  (1.803 m), weight 250 lb 9.6 oz (113.671 kg), SpO2 98 %. ECOG: 0 General appearance: alert and cooperative well-appearing gentleman slightly obese. Head: Normocephalic, without obvious abnormality no oral ulcers or lesions. Neck: no adenopathy Lymph nodes: Cervical, supraclavicular, and axillary nodes normal. Heart:regular rate and rhythm, S1, S2 normal, no murmur, click, rub or gallop Lung:chest clear, no wheezing, rales, normal symmetric air entry Abdomin: soft, non-tender, without masses or organomegaly no shifting dullness. EXT:no erythema, induration, or nodules   Lab Results: Lab Results  Component Value Date   WBC 13.5* 06/02/2014   HGB 15.7 06/02/2014   HCT 46.6 06/02/2014   MCV 88.8 06/02/2014   PLT 239 03/15/2007     Chemistry  Component Value Date/Time   NA 136 06/01/2014 1308   K 3.8 06/01/2014 1308   CL 95* 06/01/2014 1308   CO2 29 06/01/2014 1308   BUN 18 06/01/2014 1308   CREATININE 1.30 06/02/2014 1221   CREATININE 1.23 06/01/2014 1308      Component Value Date/Time   CALCIUM 9.8 06/01/2014 1308   ALKPHOS 68 06/01/2014 1308   AST 19  06/01/2014 1308   ALT 34 06/01/2014 1308   BILITOT 1.7* 06/01/2014 1308     Lupus Anticoagulant Eval   Lupus anticoagulant panel*       Collected: 10/25/14 1036   Resulting lab: [facility]   Value: REPORT (Abnormal)   Comment: A Lupus Anticoagulant is detected. Lupus Anti-coagulants (LA) may be associated with thromboticevents, recurrent abortion, or may be asymptomatic.A bleeding history requires other coagulopathiesbe excluded. Since LA may be transient,international  consensus guidelines suggest waitingat least 12 weeks before retesting to confirmantibody persistence. Philip Aspen Haemost 2006: 4;295).NOTE: Direct oral anticoagulant therapy may causefalse positive results.Reference Range: Not  Detectedhttp://education.questdiagnostics.com/faq/LupusAnticoag-------------------------------------------------------This interpretation is based on the following testresults.        Impression and Plan:  64 year old gentleman with the following issues:  1. Bilateral pulmonary emboli diagnosed in July 2015. Provoking symptoms that includes obesity, smoking and long car ride. His hypercoagulable workup showed a positive lupus anticoagulant. He has been on long-term anticoagulation since that time.  Repeat lupus anticoagulant done on 10/25/2014 showed persistently positive lupus anticoagulant.  Risks and benefits of continuing anticoagulation was discussed today. I feel the risk of thrombosis especially with a positive lupus anticoagulant exceeds the risk of bleeding. I have recommended continuing full dose anticoagulation as long as his lupus anticoagulant test is positive. He is agreeable at this time and I plan to repeat this test in 6 months.  He understands there is a risk of bleeding associated with Xarelto but the risk of thrombosis I will leave is higher.  2. Age-appropriate cancer screening: He is up-to-date at this time.  3. Follow-up: it will be in 6 months after a repeat  lupus anticoagulant testing.  [facility], MD 10/13/20161:21 PM

## 2014-12-06 NOTE — Telephone Encounter (Signed)
per Dr Alen Blew stated to put pt on sch today-added to [place]

## 2014-12-06 NOTE — Telephone Encounter (Signed)
Gave adn printed appt sched and avs for pt for March and April

## 2015-05-23 ENCOUNTER — Other Ambulatory Visit: Payer: BLUE CROSS/BLUE SHIELD

## 2015-06-10 ENCOUNTER — Telehealth: Payer: Self-pay | Admitting: Oncology

## 2015-06-10 NOTE — Telephone Encounter (Signed)
Patient returned my call and rescheduled f/u to 5/5 @ 3:15 pm. Patient aware to disregard my message re 5/3 f/u - patient now schedule for 5/5 per his request.

## 2015-06-10 NOTE — Telephone Encounter (Signed)
Call day 4/21 - moved f/u to June 20, 2022. Spoke with patient and he is not able to come June 20, 2022, however patient phone died before we could work out a new time. Rescheduled patient to next available f/u 06/26/15 @ 1:15 pm. Called back and left message for patient re new date/time for 5/3 @ 1:15 pm and asked that patient call me directly (left number) if this does not work for him.

## 2015-06-12 ENCOUNTER — Ambulatory Visit: Payer: BLUE CROSS/BLUE SHIELD | Admitting: Oncology

## 2015-06-14 ENCOUNTER — Ambulatory Visit: Payer: BLUE CROSS/BLUE SHIELD | Admitting: Oncology

## 2015-06-26 ENCOUNTER — Ambulatory Visit: Payer: BLUE CROSS/BLUE SHIELD | Admitting: Oncology

## 2015-06-28 ENCOUNTER — Telehealth: Payer: Self-pay | Admitting: Oncology

## 2015-06-28 ENCOUNTER — Ambulatory Visit (HOSPITAL_BASED_OUTPATIENT_CLINIC_OR_DEPARTMENT_OTHER): Payer: BLUE CROSS/BLUE SHIELD | Admitting: Oncology

## 2015-06-28 VITALS — BP 149/81 | HR 96 | Temp 98.6°F | Resp 18 | Ht 71.0 in | Wt 251.6 lb

## 2015-06-28 DIAGNOSIS — Z7901 Long term (current) use of anticoagulants: Secondary | ICD-10-CM

## 2015-06-28 DIAGNOSIS — D6862 Lupus anticoagulant syndrome: Secondary | ICD-10-CM | POA: Diagnosis not present

## 2015-06-28 DIAGNOSIS — Z86711 Personal history of pulmonary embolism: Secondary | ICD-10-CM

## 2015-06-28 NOTE — Progress Notes (Signed)
Hematology and Oncology Follow Up Visit  Gerald Morales [id number] 09-05-1950 65 y.o. 06/28/2015 3:03 PM Horatio Pel, MDPharr, Thayer Jew, MD   Principle Diagnosis: 65 year old gentleman with bilateral PE diagnosed in July 2015. His blood clot was provoked by potentially long travel in a 4 hour drive. 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   Prior Therapy: He was initially treated with warfarin but INR level was difficult to control.  Current therapy: Xarelto 20 mg daily. He has been on full dose anticoagulation since 09/23/2013.  Interim History: Mr. Gerald Morales presents today for a follow-up visit. Since the last visit, he reports no recent complaints. He continues to tolerate continues to be on Xarelto without any complications. He has not reported any recent thrombosis or bleeding. He is doing well on  He does not report any chest pain, shortness of breath or dyspnea on exertion. He has no difficulty obtaining this medication and has not reported any any issues with it. He continues to be active and was able to play golf today without any issues.  He does not report any headaches or vision problems. He did not report any fevers, chills, sweats. He has not reported any weight loss or appetite changes. Not report any nausea, vomiting or abdominal pain. He has not reported any frequency urgency or hesitancy. Did not report any chest pain or palpitation. Did not report any shortness of breath or cough. Did not report any lymphadenopathy or petechiae. This review of systems unremarkable.   Medications: I have reviewed the patient's current medications.  Current Outpatient Prescriptions  Medication Sig Dispense Refill  . losartan-hydrochlorothiazide (HYZAAR) 100-25 MG per tablet Take 1 tablet by mouth daily.     . rivaroxaban (XARELTO) 20 MG TABS tablet Take 20 mg by mouth daily with supper.    . rosuvastatin (CRESTOR) 20 MG tablet Take 20 mg by mouth daily.    Marland Kitchen VIAGRA 100  MG tablet Take by mouth as needed.      No current facility-administered medications for this visit.     Allergies:  Allergies  Allergen Reactions  . Lipitor [Atorvastatin] Anxiety and Other (See Comments)    Patient stated,"anxiety, increased heart rate, nervousness."  . Vytorin [Ezetimibe-Simvastatin] Anxiety and Other (See Comments)    Patient stated,"anxiety, increased heart rate, nervousness."    Past Medical History, Surgical history, Social history, and Family History were reviewed and updated.   Physical Exam: Blood pressure 149/81, pulse 96, temperature 98.6 F (37 C), temperature source Oral, resp. rate 18, height 5\' 11"  (1.803 m), weight 251 lb 9.6 oz (114.125 kg), SpO2 98 %. ECOG: 0 General appearance: Alert, awake gentleman without distress. Head: Normocephalic, without obvious abnormality no oral thrush noted. Neck: no adenopathy Lymph nodes: Cervical, supraclavicular, and axillary nodes normal. Heart:regular rate and rhythm, S1, S2 normal, no murmur, click, rub or gallop Lung:chest clear, no wheezing, rales, normal symmetric air entry Abdomin: soft, non-tender, without masses or organomegaly no rebound or guarding. EXT:no erythema, induration, or nodules   Lab Results: Lab Results  Component Value Date   WBC 13.5* 06/02/2014   HGB 15.7 06/02/2014   HCT 46.6 06/02/2014   MCV 88.8 06/02/2014   PLT 239 03/15/2007     Chemistry      Component Value Date/Time   NA 136 06/01/2014 1308   K 3.8 06/01/2014 1308   CL 95* 06/01/2014 1308   CO2 29 06/01/2014 1308   BUN 18 06/01/2014 1308   CREATININE 1.30 06/02/2014 1221   CREATININE  1.23 06/01/2014 1308      Component Value Date/Time   CALCIUM 9.8 06/01/2014 1308   ALKPHOS 68 06/01/2014 1308   AST 19 06/01/2014 1308   ALT 34 06/01/2014 1308   BILITOT 1.7* 06/01/2014 1308     Lupus Anticoagulant Eval   Lupus anticoagulant panel*       Collected: 10/25/14 1036   Resulting lab: [facility]    Value: REPORT (Abnormal)   Comment: A Lupus Anticoagulant is detected. Lupus Anti-coagulants (LA) may be associated with thromboticevents, recurrent abortion, or may be asymptomatic.A bleeding history requires other coagulopathiesbe excluded. Since LA may be transient,international  consensus guidelines suggest waitingat least 12 weeks before retesting to confirmantibody persistence. Philip Aspen Haemost 2006: 4;295).NOTE: Direct oral anticoagulant therapy may causefalse positive results.Reference Range: Not  Detectedhttp://education.questdiagnostics.com/faq/LupusAnticoag-------------------------------------------------------This interpretation is based on the following testresults.        Impression and Plan:  65 year old gentleman with the following issues:  1. Bilateral pulmonary emboli diagnosed in July 2015. Provoking symptoms that includes obesity, smoking and long car ride. His hypercoagulable workup showed a positive lupus anticoagulant. He has been on long-term anticoagulation since that time.  Repeat lupus anticoagulant done on 10/25/2014 showed persistently positive lupus anticoagulant.  Risks and benefits of continuing anticoagulation were reviewed again today. Given his high-risk repeat thrombosis and his preference, he prefers to continue on anticoagulation indefinitely. We will reevaluate him in 6 months and repeat lupus anticoagulant at the time.   2. Age-appropriate cancer screening: He is up-to-date at this time.  3. Follow-up: it will be in 6 months.   Zola Button, MD 5/5/20173:03 PM

## 2015-06-28 NOTE — Telephone Encounter (Signed)
per pof to sch pt appt-gave pt copy of avs °

## 2015-08-22 DIAGNOSIS — M25569 Pain in unspecified knee: Secondary | ICD-10-CM | POA: Diagnosis not present

## 2015-08-29 DIAGNOSIS — I2699 Other pulmonary embolism without acute cor pulmonale: Secondary | ICD-10-CM | POA: Diagnosis not present

## 2015-08-29 DIAGNOSIS — M79669 Pain in unspecified lower leg: Secondary | ICD-10-CM | POA: Diagnosis not present

## 2015-12-20 DIAGNOSIS — M79669 Pain in unspecified lower leg: Secondary | ICD-10-CM | POA: Diagnosis not present

## 2016-01-01 ENCOUNTER — Other Ambulatory Visit: Payer: Self-pay | Admitting: Oncology

## 2016-01-01 DIAGNOSIS — Z86711 Personal history of pulmonary embolism: Secondary | ICD-10-CM

## 2016-01-01 DIAGNOSIS — Z125 Encounter for screening for malignant neoplasm of prostate: Secondary | ICD-10-CM | POA: Diagnosis not present

## 2016-01-01 DIAGNOSIS — E7800 Pure hypercholesterolemia, unspecified: Secondary | ICD-10-CM | POA: Diagnosis not present

## 2016-01-01 DIAGNOSIS — Z0000 Encounter for general adult medical examination without abnormal findings: Secondary | ICD-10-CM | POA: Diagnosis not present

## 2016-01-02 ENCOUNTER — Ambulatory Visit (HOSPITAL_BASED_OUTPATIENT_CLINIC_OR_DEPARTMENT_OTHER): Payer: BLUE CROSS/BLUE SHIELD | Admitting: Oncology

## 2016-01-02 ENCOUNTER — Other Ambulatory Visit: Payer: Self-pay | Admitting: Internal Medicine

## 2016-01-02 ENCOUNTER — Other Ambulatory Visit (HOSPITAL_BASED_OUTPATIENT_CLINIC_OR_DEPARTMENT_OTHER): Payer: BLUE CROSS/BLUE SHIELD

## 2016-01-02 ENCOUNTER — Telehealth: Payer: Self-pay | Admitting: Oncology

## 2016-01-02 VITALS — BP 153/70 | HR 96 | Temp 97.6°F | Resp 18 | Ht 71.0 in | Wt 259.8 lb

## 2016-01-02 DIAGNOSIS — Z86711 Personal history of pulmonary embolism: Secondary | ICD-10-CM

## 2016-01-02 DIAGNOSIS — Z23 Encounter for immunization: Secondary | ICD-10-CM | POA: Diagnosis not present

## 2016-01-02 DIAGNOSIS — Z7901 Long term (current) use of anticoagulants: Secondary | ICD-10-CM

## 2016-01-02 DIAGNOSIS — I10 Essential (primary) hypertension: Secondary | ICD-10-CM | POA: Diagnosis not present

## 2016-01-02 DIAGNOSIS — Z0000 Encounter for general adult medical examination without abnormal findings: Secondary | ICD-10-CM | POA: Diagnosis not present

## 2016-01-02 DIAGNOSIS — R062 Wheezing: Secondary | ICD-10-CM | POA: Diagnosis not present

## 2016-01-02 DIAGNOSIS — R05 Cough: Secondary | ICD-10-CM | POA: Diagnosis not present

## 2016-01-02 DIAGNOSIS — Z136 Encounter for screening for cardiovascular disorders: Secondary | ICD-10-CM

## 2016-01-02 DIAGNOSIS — Z87891 Personal history of nicotine dependence: Secondary | ICD-10-CM | POA: Diagnosis not present

## 2016-01-02 NOTE — Telephone Encounter (Signed)
Appointments scheduled per 01/02/16 los. AVS report and appointment schedule given to patient per 01/02/16 los. °

## 2016-01-02 NOTE — Progress Notes (Signed)
Hematology and Oncology Follow Up Visit  Gerald Morales [id number] March 29, 1950 65 y.o. 01/02/2016 9:37 AM Gerald Morales, MDPharr, Thayer Jew, MD   Principle Diagnosis: 65 year old gentleman with bilateral PE diagnosed in July 2015. His blood clot was provoked by potentially long travel in a 4 hour drive. 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   Prior Therapy: He was initially treated with warfarin but INR level was difficult to control.  Current therapy: Xarelto 20 mg daily. He has been on full dose anticoagulation since 09/23/2013.  Interim History: [name] presents today for a follow-up visit. Since the last visit, he reports no changes in his health. He continues to tolerate continues to be on Xarelto without any complications. He has not reported any recent thrombosis or bleeding. He is doing well on  He does not report any chest pain, shortness of breath or dyspnea on exertion. He has no difficulty obtaining this medication and has not reported any any issues with it. He does not report any major financial burden of continuing this medication.  He does not report any headaches or vision problems. He did not report any fevers, chills, sweats. He has not reported any weight loss or appetite changes. Not report any nausea, vomiting or abdominal pain. He has not reported any frequency urgency or hesitancy. Did not report any chest pain or palpitation. Did not report any shortness of breath or cough. Did not report any lymphadenopathy or petechiae. This review of systems unremarkable.   Medications: I have reviewed the patient's current medications.  Current Outpatient Prescriptions  Medication Sig Dispense Refill  . losartan-hydrochlorothiazide (HYZAAR) 100-25 MG per tablet Take 1 tablet by mouth daily.     . rivaroxaban (XARELTO) 20 MG TABS tablet Take 20 mg by mouth daily with supper.    . rosuvastatin (CRESTOR) 20 MG tablet Take 20 mg by mouth daily.     No  current facility-administered medications for this visit.      Allergies:  Allergies  Allergen Reactions  . Lipitor [Atorvastatin] Anxiety and Other (See Comments)    Patient stated,"anxiety, increased heart rate, nervousness."  . Vytorin [Ezetimibe-Simvastatin] Anxiety and Other (See Comments)    Patient stated,"anxiety, increased heart rate, nervousness."    Past Medical History, Surgical history, Social history, and Family History were reviewed and updated.   Physical Exam: Blood pressure (!) 153/70, pulse 96, temperature 97.6 F (36.4 C), temperature source Oral, resp. rate 18, height 5\' 11"  (1.803 m), weight 259 lb 12.8 oz (117.8 kg), SpO2 95 %. ECOG: 0 General appearance: Well-appearing gentleman without distress. Head: Normocephalic, without obvious abnormality no oral thrush noted. Neck: no adenopathy Lymph nodes: Cervical, supraclavicular, and axillary nodes normal. Heart:regular rate and rhythm, S1, S2 normal, no murmur, click, rub or gallop Lung:chest clear, no wheezing, rales, normal symmetric air entry Abdomin: soft, non-tender, without masses or organomegaly no shifting dullness or ascites.. EXT:no erythema, induration, or nodules   Lab Results: Lab Results  Component Value Date   WBC 13.5 (A) 06/02/2014   HGB 15.7 06/02/2014   HCT 46.6 06/02/2014   MCV 88.8 06/02/2014   PLT 239 03/15/2007     Chemistry      Component Value Date/Time   NA 136 06/01/2014 1308   K 3.8 06/01/2014 1308   CL 95 (L) 06/01/2014 1308   CO2 29 06/01/2014 1308   BUN 18 06/01/2014 1308   CREATININE 1.30 06/02/2014 1221   CREATININE 1.23 06/01/2014 1308      Component Value Date/Time  CALCIUM 9.8 06/01/2014 1308   ALKPHOS 68 06/01/2014 1308   AST 19 06/01/2014 1308   ALT 34 06/01/2014 1308   BILITOT 1.7 (H) 06/01/2014 1308     Lupus Anticoagulant Eval   Lupus anticoagulant panel*       Collected: 10/25/14 1036   Resulting lab: [facility]   Value: REPORT  (Abnormal)   Comment: A Lupus Anticoagulant is detected. Lupus Anti-coagulants (LA) may be associated with thromboticevents, recurrent abortion, or may be asymptomatic.A bleeding history requires other coagulopathiesbe excluded. Since LA may be transient,international  consensus guidelines suggest waitingat least 12 weeks before retesting to confirmantibody persistence. Gerald Morales Haemost 2006: 4;295).NOTE: Direct oral anticoagulant therapy may causefalse positive results.Reference Range: Not  Detectedhttp://education.questdiagnostics.com/faq/LupusAnticoag-------------------------------------------------------This interpretation is based on the following testresults.        Impression and Plan:  65 year old gentleman with the following issues:  1. Bilateral pulmonary emboli diagnosed in July 2015. Provoking symptoms that includes obesity, smoking and long car ride. His hypercoagulable workup showed a positive lupus anticoagulant. He has been on long-term anticoagulation since that time.  Repeat lupus anticoagulant done on 10/25/2014 showed persistently positive lupus anticoagulant.  I recommended continuing long-term Xarelto anticoagulation after discussing the risks and benefits today. He did have a serious pulmonary embolism but is unprovoked in the setting of a positive lupus anticoagulant. He has no objections to taking this medication at this time and we will continue to check on him periodically to ensure no complications.   2. Age-appropriate cancer screening: He is up-to-date at this time.  3. Follow-up: it will be in 6 months.   [id number], MD 11/9/20179:37 AM

## 2016-01-03 ENCOUNTER — Other Ambulatory Visit (HOSPITAL_COMMUNITY): Payer: Self-pay | Admitting: Respiratory Therapy

## 2016-01-03 DIAGNOSIS — R062 Wheezing: Secondary | ICD-10-CM

## 2016-01-03 DIAGNOSIS — R05 Cough: Secondary | ICD-10-CM

## 2016-01-03 DIAGNOSIS — R053 Chronic cough: Secondary | ICD-10-CM

## 2016-01-03 DIAGNOSIS — Z87891 Personal history of nicotine dependence: Secondary | ICD-10-CM

## 2016-01-04 LAB — LUPUS ANTICOAGULANT PANEL
DRVVT CONFIRM: 1.5 ratio — AB (ref 0.8–1.2)
DRVVT: 108.2 s — AB (ref 0.0–47.0)
PTT-LA: 43.8 s (ref 0.0–51.9)
dRVVT Mix: 84.9 s — ABNORMAL HIGH (ref 0.0–47.0)

## 2016-01-10 ENCOUNTER — Ambulatory Visit (HOSPITAL_COMMUNITY)
Admission: RE | Admit: 2016-01-10 | Discharge: 2016-01-10 | Disposition: A | Discharge status: Home / Self Care | Payer: BLUE CROSS/BLUE SHIELD | Source: Ambulatory Visit | Attending: Internal Medicine | Admitting: Internal Medicine

## 2016-01-10 DIAGNOSIS — R062 Wheezing: Secondary | ICD-10-CM | POA: Diagnosis not present

## 2016-01-10 DIAGNOSIS — R05 Cough: Secondary | ICD-10-CM | POA: Diagnosis not present

## 2016-01-10 DIAGNOSIS — Z87891 Personal history of nicotine dependence: Secondary | ICD-10-CM | POA: Insufficient documentation

## 2016-01-10 LAB — PULMONARY FUNCTION TEST
DL/VA % PRED: 103 %
DL/VA: 4.82 ml/min/mmHg/L
DLCO unc % pred: 76 %
DLCO unc: 25.93 ml/min/mmHg
FEF 25-75 POST: 2.55 L/s
FEF 25-75 PRE: 2.76 L/s
FEF2575-%Change-Post: -7 %
FEF2575-%PRED-PRE: 102 %
FEF2575-%Pred-Post: 94 %
FEV1-%Change-Post: -2 %
FEV1-%PRED-PRE: 87 %
FEV1-%Pred-Post: 85 %
FEV1-Post: 2.95 L
FEV1-Pre: 3.03 L
FEV1FVC-%Change-Post: 9 %
FEV1FVC-%Pred-Pre: 105 %
FEV6-%Change-Post: -9 %
FEV6-%PRED-POST: 77 %
FEV6-%Pred-Pre: 86 %
FEV6-POST: 3.44 L
FEV6-Pre: 3.82 L
FEV6FVC-%Change-Post: 1 %
FEV6FVC-%PRED-POST: 105 %
FEV6FVC-%Pred-Pre: 103 %
FVC-%Change-Post: -11 %
FVC-%Pred-Post: 73 %
FVC-%Pred-Pre: 82 %
FVC-Post: 3.44 L
FVC-Pre: 3.87 L
Post FEV1/FVC ratio: 86 %
Post FEV6/FVC ratio: 100 %
Pre FEV1/FVC ratio: 78 %
Pre FEV6/FVC Ratio: 99 %
RV % pred: 88 %
RV: 2.18 L
TLC % pred: 82 %
TLC: 5.99 L

## 2016-01-10 MED ORDER — ALBUTEROL SULFATE (2.5 MG/3ML) 0.083% IN NEBU
2.5000 mg | INHALATION_SOLUTION | Freq: Once | RESPIRATORY_TRACT | Status: AC
  Administered 2016-01-10: 2.5 mg via RESPIRATORY_TRACT

## 2016-01-15 ENCOUNTER — Ambulatory Visit
Admission: RE | Admit: 2016-01-15 | Discharge: 2016-01-15 | Disposition: A | Discharge status: Home / Self Care | Payer: BLUE CROSS/BLUE SHIELD | Source: Ambulatory Visit | Attending: Internal Medicine | Admitting: Internal Medicine

## 2016-01-15 DIAGNOSIS — Z136 Encounter for screening for cardiovascular disorders: Secondary | ICD-10-CM

## 2016-01-15 DIAGNOSIS — Z87891 Personal history of nicotine dependence: Secondary | ICD-10-CM | POA: Diagnosis not present

## 2016-05-16 ENCOUNTER — Emergency Department (HOSPITAL_COMMUNITY): Payer: BLUE CROSS/BLUE SHIELD

## 2016-05-16 ENCOUNTER — Emergency Department (HOSPITAL_COMMUNITY)
Admission: EM | Admit: 2016-05-16 | Discharge: 2016-05-17 | Disposition: A | Discharge status: Home / Self Care | Payer: BLUE CROSS/BLUE SHIELD | Attending: Emergency Medicine | Admitting: Emergency Medicine

## 2016-05-16 ENCOUNTER — Encounter (HOSPITAL_COMMUNITY): Payer: Self-pay | Admitting: Oncology

## 2016-05-16 DIAGNOSIS — S39011A Strain of muscle, fascia and tendon of abdomen, initial encounter: Secondary | ICD-10-CM | POA: Insufficient documentation

## 2016-05-16 DIAGNOSIS — Z8551 Personal history of malignant neoplasm of bladder: Secondary | ICD-10-CM | POA: Insufficient documentation

## 2016-05-16 DIAGNOSIS — Y929 Unspecified place or not applicable: Secondary | ICD-10-CM | POA: Insufficient documentation

## 2016-05-16 DIAGNOSIS — S3991XA Unspecified injury of abdomen, initial encounter: Secondary | ICD-10-CM | POA: Diagnosis present

## 2016-05-16 DIAGNOSIS — R1031 Right lower quadrant pain: Secondary | ICD-10-CM | POA: Diagnosis not present

## 2016-05-16 DIAGNOSIS — Z79899 Other long term (current) drug therapy: Secondary | ICD-10-CM | POA: Insufficient documentation

## 2016-05-16 DIAGNOSIS — I10 Essential (primary) hypertension: Secondary | ICD-10-CM | POA: Insufficient documentation

## 2016-05-16 DIAGNOSIS — X58XXXA Exposure to other specified factors, initial encounter: Secondary | ICD-10-CM | POA: Insufficient documentation

## 2016-05-16 DIAGNOSIS — Y939 Activity, unspecified: Secondary | ICD-10-CM | POA: Diagnosis not present

## 2016-05-16 DIAGNOSIS — Z87891 Personal history of nicotine dependence: Secondary | ICD-10-CM | POA: Insufficient documentation

## 2016-05-16 DIAGNOSIS — R109 Unspecified abdominal pain: Secondary | ICD-10-CM | POA: Diagnosis not present

## 2016-05-16 DIAGNOSIS — Y999 Unspecified external cause status: Secondary | ICD-10-CM | POA: Diagnosis not present

## 2016-05-16 DIAGNOSIS — T148XXA Other injury of unspecified body region, initial encounter: Secondary | ICD-10-CM

## 2016-05-16 HISTORY — DX: Other pulmonary embolism without acute cor pulmonale: I26.99

## 2016-05-16 HISTORY — DX: Hyperlipidemia, unspecified: E78.5

## 2016-05-16 LAB — BASIC METABOLIC PANEL
Anion gap: 9 (ref 5–15)
BUN: 14 mg/dL (ref 6–20)
CO2: 24 mmol/L (ref 22–32)
Calcium: 9.2 mg/dL (ref 8.9–10.3)
Chloride: 104 mmol/L (ref 101–111)
Creatinine, Ser: 1.07 mg/dL (ref 0.61–1.24)
GFR calc Af Amer: >60 mL/min (ref >60)
GFR calc non Af Amer: >60 mL/min (ref >60)
GLUCOSE: 107 mg/dL — AB (ref 65–99)
POTASSIUM: 3.5 mmol/L (ref 3.5–5.1)
Sodium: 137 mmol/L (ref 135–145)

## 2016-05-16 LAB — CBC WITH DIFFERENTIAL/PLATELET
BASOS ABS: 0 10*3/uL (ref 0.0–0.1)
Basophils Relative: 1 %
EOS PCT: 3 %
Eosinophils Absolute: 0.2 10*3/uL (ref 0.0–0.7)
HCT: 45.5 % (ref 39.0–52.0)
HEMOGLOBIN: 16.3 g/dL (ref 13.0–17.0)
Lymphocytes Relative: 7 %
Lymphs Abs: 0.5 10*3/uL — ABNORMAL LOW (ref 0.7–4.0)
MCH: 30 pg (ref 26.0–34.0)
MCHC: 35.8 g/dL (ref 30.0–36.0)
MCV: 83.6 fL (ref 78.0–100.0)
Monocytes Absolute: 0.7 10*3/uL (ref 0.1–1.0)
Monocytes Relative: 10 %
NEUTROS PCT: 79 %
Neutro Abs: 6.1 10*3/uL (ref 1.7–7.7)
PLATELETS: 177 10*3/uL (ref 150–400)
RBC: 5.44 MIL/uL (ref 4.22–5.81)
RDW: 12.8 % (ref 11.5–15.5)
WBC: 7.6 10*3/uL (ref 4.0–10.5)

## 2016-05-16 LAB — URINALYSIS, ROUTINE W REFLEX MICROSCOPIC
Bilirubin Urine: NEGATIVE
Glucose, UA: NEGATIVE mg/dL
HGB URINE DIPSTICK: NEGATIVE
KETONES UR: NEGATIVE mg/dL
Leukocytes, UA: NEGATIVE
NITRITE: NEGATIVE
PROTEIN: NEGATIVE mg/dL
SPECIFIC GRAVITY, URINE: 1.016 (ref 1.005–1.030)
pH: 6 (ref 5.0–8.0)

## 2016-05-16 MED ORDER — KETOROLAC TROMETHAMINE 30 MG/ML IJ SOLN
30.0000 mg | Freq: Once | INTRAMUSCULAR | Status: AC
  Administered 2016-05-16: 30 mg via INTRAVENOUS
  Filled 2016-05-16: qty 1

## 2016-05-16 NOTE — ED Provider Notes (Signed)
Gerald Morales DEPT Provider Note   CSN: [id number] Arrival date & time: 05/16/16  2112     History   Chief Complaint Chief Complaint  Patient presents with  . Flank Pain    Gerald Morales is a 66 y.o. male.  Gerald 66 year old male who presents with right flank pain. He has a history of PE on Xarelto, hypertension, hyperlipidemia, bladder cancer. States that yesterday evening while lying on the couch had sudden onset of sharp pain over the right flank. Since then he has had pain that is worse with movement, coughing, and palpation. Does not remember any fall, strenuous activity, or heavy lifting. Denies any chest pain, difficulty breathing, syncope or near syncope, fevers, dysuria, urinary frequency, hematuria, abdominal pain, vomiting or diarrhea. Past Medical History:  Diagnosis Date  . Cancer ([place])    bladder  . Hyperlipidemia   . Hypertension   . PE (pulmonary thromboembolism) [facility])     Patient Active Problem List   Diagnosis Date Noted  . Hx of bladder cancer 06/02/2014  . Hx pulmonary embolism 06/02/2014  . Diverticulitis of colon 06/02/2014  . Erectile dysfunction 06/02/2014  . Essential hypertension 06/02/2014  . Hyperlipidemia 06/02/2014    Past Surgical History:  Procedure Laterality Date  . FRACTURE SURGERY    . HERNIA REPAIR         Home Medications    Prior to Admission medications   Medication Sig Start Date End Date Taking? Authorizing Provider  losartan-hydrochlorothiazide (HYZAAR) 100-25 MG per tablet Take 1 tablet by mouth daily.  10/31/13  Yes Historical Provider, MD  rivaroxaban (XARELTO) 20 MG TABS tablet Take 20 mg by mouth daily.    Yes Historical Provider, MD  rosuvastatin (CRESTOR) 20 MG tablet Take 10 mg by mouth daily.    Yes Historical Provider, MD    Family History No family history on file.  Social History Social History  Substance Use Topics  . Smoking status: Former Research scientist (life sciences)  . Smokeless tobacco: Former Systems developer  . Alcohol  use No     Allergies   Lipitor [atorvastatin] and Vytorin [ezetimibe-simvastatin]   Review of Systems Review of Systems  Constitutional: Negative for fever.  HENT: Negative for congestion.   Respiratory: Negative for shortness of breath.   Cardiovascular: Negative for chest pain and leg swelling.  Gastrointestinal: Negative for abdominal pain.  Genitourinary: Positive for flank pain. Negative for dysuria and hematuria.  Musculoskeletal: Negative for back pain.  Allergic/Immunologic: Negative for immunocompromised state.  Neurological: Negative for syncope.  Hematological: Bruises/bleeds easily.  Psychiatric/Behavioral: Negative for confusion.  All other systems reviewed and are negative.    Physical Exam Updated Vital Signs BP (!) 143/89 (BP Location: Left Arm)   Pulse 92   Temp 98.9 F (37.2 C) (Oral)   Resp 20   Ht 5\' 11"  (1.803 m)   Wt 258 lb (117 kg)   SpO2 94%   BMI 35.98 kg/m   Physical Exam Physical Exam  Nursing note and vitals reviewed. Constitutional: Well developed, well nourished, non-toxic, and in no acute distress Head: Normocephalic and atraumatic.  Mouth/Throat: Oropharynx is clear and moist.  Neck: Normal range of motion. Neck supple.  Cardiovascular: Normal rate and regular rhythm.   Pulmonary/Chest: Effort normal and breath sounds normal.  Abdominal: Soft. There is no tenderness. There is no rebound and no guarding. Tenderness over right lower flank to palpation and percussion Musculoskeletal: Normal range of motion.  Neurological: Alert, no facial droop, fluent speech, moves all extremities symmetrically  Skin: Skin is warm and dry.  Psychiatric: Cooperative   ED Treatments / Results  Labs (all labs ordered are listed, but only abnormal results are displayed) Labs Reviewed  CBC WITH DIFFERENTIAL/PLATELET - Abnormal; Notable for the following:       Result Value   Lymphs Abs 0.5 (*)    All other components within normal limits  BASIC  METABOLIC PANEL - Abnormal; Notable for the following:    Glucose, Bld 107 (*)    All other components within normal limits  URINALYSIS, ROUTINE W REFLEX MICROSCOPIC    EKG  EKG Interpretation None       Radiology Ct Renal Stone Study  Result Date: 05/16/2016 CLINICAL DATA:  Acute onset of right flank pain.  Initial encounter. EXAM: CT ABDOMEN AND PELVIS WITHOUT CONTRAST TECHNIQUE: Multidetector CT imaging of the abdomen and pelvis was performed following the standard protocol without IV contrast. COMPARISON:  CT of the abdomen and pelvis from 06/02/2014 FINDINGS: Lower chest: Minimal honeycombing is noted at the right lung base, and minimal bibasilar atelectasis or scarring is seen. The visualized portions of the mediastinum are unremarkable. Hepatobiliary: The liver is unremarkable in appearance. The gallbladder is unremarkable in appearance. The common bile duct remains normal in caliber. Pancreas: The pancreas is within normal limits. Spleen: The spleen is unremarkable in appearance. Adrenals/Urinary Tract: There is mild stable nodularity at the adrenal glands bilaterally. Nonspecific perinephric stranding is noted bilaterally. The kidneys are otherwise unremarkable. There is no evidence of hydronephrosis. No renal or ureteral stones are seen. Stomach/Bowel: The stomach is unremarkable in appearance. The small bowel is within normal limits. The appendix is normal in caliber, without evidence of appendicitis. Scattered diverticulosis is noted along the sigmoid colon, without evidence of diverticulitis. Vascular/Lymphatic: Minimal calcification is seen along the abdominal aorta. No retroperitoneal or pelvic sidewall lymphadenopathy is seen. Reproductive: The bladder is mildly distended and grossly unremarkable. The prostate is borderline prominent. Other: No additional soft tissue abnormalities are seen. Musculoskeletal: No acute osseous abnormalities are identified. Chronic bilateral pars defects  are seen at L5, without evidence of anterolisthesis. The visualized musculature is unremarkable in appearance. IMPRESSION: 1. No evidence of hydronephrosis.  No renal or ureteral stone seen. 2. Minimal honeycombing at the right lung base, and minimal bibasilar atelectasis or scarring. 3. Scattered diverticulosis along the sigmoid colon, without evidence of diverticulitis. 4. Borderline prominence of the prostate. 5. Chronic bilateral pars defects at L5, without evidence of anterolisthesis. Electronically Signed   By: Garald Balding M.D.   On: 05/16/2016 22:52    Procedures Procedures (including critical care time)  Medications Ordered in ED Medications  ketorolac (TORADOL) 30 MG/ML injection 30 mg (30 mg Intravenous Given 05/16/16 2249)     Initial Impression / Assessment and Plan / ED Course  I have reviewed the triage vital signs and the nursing notes.  Pertinent labs & imaging results that were available during my care of the patient were reviewed by me and considered in my medical decision making (see chart for details).     66 year old male who presents with intermittent right flank pain since yesterday. Is nontoxic in no acute distress with normal vital signs. He is a soft and benign abdomen. Seems more muscular. Given that it primarily comes on with movement, coughing, and palpation. However, he states that he has not had any significant precipitating factor to cause muscle strain. His blood work is reassuring and urinalysis without any urine or infection. CT renal stone study was performed  to rule out potential urolithiasis or other serious retroperitoneal etiologies. This is visualized and does not show acute cause of his pain. Discussed outpatient management for potential muscle strain. Strict return and follow-up instructions reviewed. He expressed understanding of all discharge instructions and felt comfortable with the plan of care.   Final Clinical Impressions(s) / ED Diagnoses    Final diagnoses:  Right flank pain  Muscle strain    New Prescriptions New Prescriptions   No medications on file     Forde Dandy, MD 05/16/16 2346

## 2016-05-16 NOTE — ED Notes (Signed)
First attempt to collect blood patient not in room

## 2016-05-16 NOTE — ED Triage Notes (Signed)
Pt c/o right sided flank pain that started yesterday night.  Denies urinary sx.  Pt rates pain 8/10, dull stabbing in nature.

## 2016-05-16 NOTE — ED Notes (Signed)
2nd attempt to collect blood samples RN requested I wait that they were about to start an IV and they would collect samples

## 2016-05-16 NOTE — Discharge Instructions (Signed)
Your pain seems consistent with that of muscle strain. Take aleve or ibuprofen for pain and use heat packs as needed. It may take 1-2 weeks to get fully better  Follow-up with your primary care doctor as needed.  Your work-up today is reassuring. Your CT does not show any serious processes in the abdomen or back.   Return for worsening symptoms, including escalating pain, fever, vomiting or any other symptoms concerning to you

## 2016-07-01 ENCOUNTER — Ambulatory Visit: Payer: BLUE CROSS/BLUE SHIELD | Admitting: Oncology

## 2016-07-01 DIAGNOSIS — I10 Essential (primary) hypertension: Secondary | ICD-10-CM | POA: Diagnosis not present

## 2016-08-12 ENCOUNTER — Ambulatory Visit (HOSPITAL_BASED_OUTPATIENT_CLINIC_OR_DEPARTMENT_OTHER): Payer: BLUE CROSS/BLUE SHIELD | Admitting: Oncology

## 2016-08-12 VITALS — BP 151/81 | HR 68 | Temp 98.4°F | Resp 20 | Ht 71.0 in | Wt 248.2 lb

## 2016-08-12 DIAGNOSIS — Z7901 Long term (current) use of anticoagulants: Secondary | ICD-10-CM

## 2016-08-12 DIAGNOSIS — Z86711 Personal history of pulmonary embolism: Secondary | ICD-10-CM

## 2016-08-12 DIAGNOSIS — D6862 Lupus anticoagulant syndrome: Secondary | ICD-10-CM | POA: Diagnosis not present

## 2016-08-12 NOTE — Progress Notes (Signed)
Hematology and Oncology Follow Up Visit  Gerald Morales [id number] [birth year] 66 y.o. 08/12/2016 9:35 AM Gerald Morales, MDPharr, Thayer Jew, MD   Principle Diagnosis: 66 year old gentleman with bilateral PE diagnosed in July 2015. His blood clot was provoked by potentially long travel in a 4 hour drive. 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   Prior Therapy: He was initially treated with warfarin but INR level was difficult to control.  Current therapy: Xarelto 20 mg daily. He has been on full dose anticoagulation since 09/23/2013.  Interim History: Gerald Morales presents today for a follow-up visit. Since the last visit, he continues to do well without any recent complaints. He continues to tolerate continues to be on Xarelto without any complications. He has not reported any recent thrombosis or bleeding. He does not report any chest pain, shortness of breath or dyspnea on exertion. He has no difficulty obtaining this medication and has not reported any any issues with it. He is no objections or complications related to. He remains active with good performance status.  He does not report any headaches or vision problems. He did not report any fevers, chills, sweats. He has not reported any weight loss or appetite changes. Not report any nausea, vomiting or abdominal pain. He has not reported any frequency urgency or hesitancy. Did not report any chest pain or palpitation. Did not report any shortness of breath or cough. Did not report any lymphadenopathy or petechiae. This review of systems unremarkable.   Medications: I have reviewed the patient's current medications.  Current Outpatient Prescriptions  Medication Sig Dispense Refill  . losartan-hydrochlorothiazide (HYZAAR) 100-25 MG per tablet Take 1 tablet by mouth daily.     . rivaroxaban (XARELTO) 20 MG TABS tablet Take 20 mg by mouth daily.     . rosuvastatin (CRESTOR) 20 MG tablet Take 10 mg by mouth daily.      No  current facility-administered medications for this visit.      Allergies:  Allergies  Allergen Reactions  . Lipitor [Atorvastatin] Anxiety and Other (See Comments)    Patient stated,"anxiety, increased heart rate, nervousness."  . Vytorin [Ezetimibe-Simvastatin] Anxiety and Other (See Comments)    Patient stated,"anxiety, increased heart rate, nervousness."    Past Medical History, Surgical history, Social history, and Family History were reviewed and updated.   Physical Exam: Blood pressure (!) 151/81, pulse 68, temperature 98.4 F (36.9 C), temperature source Oral, resp. rate 20, height 5\' 11"  (1.803 m), weight 248 lb 3.2 oz (112.6 kg), SpO2 95 %. ECOG: 0 General appearance: Alert, awake gentleman without distress. Head: Normocephalic, without obvious abnormality no oral thrush noted. Neck: no adenopathy Lymph nodes: Cervical, supraclavicular, and axillary nodes normal. Heart:regular rate and rhythm, S1, S2 normal, no murmur, click, rub or gallop Lung:chest clear, no wheezing, rales, normal symmetric air entry Abdomin: soft, non-tender, without masses or organomegaly no rebound or guarding. EXT:no erythema, induration, or nodules   Lab Results: Lab Results  Component Value Date   WBC 7.6 05/16/2016   HGB 16.3 05/16/2016   HCT 45.5 05/16/2016   MCV 83.6 05/16/2016   PLT 177 05/16/2016     Chemistry      Component Value Date/Time   NA 137 05/16/2016 2244   K 3.5 05/16/2016 2244   CL 104 05/16/2016 2244   CO2 24 05/16/2016 2244   BUN 14 05/16/2016 2244   CREATININE 1.07 05/16/2016 2244   CREATININE 1.23 06/01/2014 1308      Component Value Date/Time   CALCIUM  9.2 05/16/2016 2244   ALKPHOS 68 06/01/2014 1308   AST 19 06/01/2014 1308   ALT 34 06/01/2014 1308   BILITOT 1.7 (H) 06/01/2014 1308     Lupus Anticoagulant Eval   Lupus anticoagulant panel*       Collected: 10/25/14 1036   Resulting lab: [facility]   Value: REPORT (Abnormal)   Comment: A  Lupus Anticoagulant is detected. Lupus Anti-coagulants (LA) may be associated with thromboticevents, recurrent abortion, or may be asymptomatic.A bleeding history requires other coagulopathiesbe excluded. Since LA may be transient,international  consensus guidelines suggest waitingat least 12 weeks before retesting to confirmantibody persistence. Gerald Morales Haemost 2006: 4;295).NOTE: Direct oral anticoagulant therapy may causefalse positive results.Reference Range: Not  Detectedhttp://education.questdiagnostics.com/faq/LupusAnticoag-------------------------------------------------------This interpretation is based on the following testresults.        Impression and Plan:  66 year old gentleman with the following issues:  1. Bilateral pulmonary emboli diagnosed in July 2015. Provoking symptoms that includes obesity, smoking and long car ride. His hypercoagulable workup showed a positive lupus anticoagulant. He has been on long-term anticoagulation since that time.  Repeat lupus anticoagulant done on 10/25/2014 showed persistently positive lupus anticoagulant.  Risks and benefits of continuing Xarelto were reviewed again today and given his persistent positive lupus anticoagulant I have recommended left arm anticoagulation on Xarelto. These can be revisited in the future if she develops complications related to this medication.   2. Age-appropriate cancer screening: He is up-to-date at this time.  3. Follow-up: I'm happy to see him in the future as needed.  [facility], MD 6/20/20189:35 AM

## 2016-08-17 DIAGNOSIS — R42 Dizziness and giddiness: Secondary | ICD-10-CM | POA: Diagnosis not present

## 2016-08-17 DIAGNOSIS — R197 Diarrhea, unspecified: Secondary | ICD-10-CM | POA: Diagnosis not present

## 2016-09-28 DIAGNOSIS — S299XXA Unspecified injury of thorax, initial encounter: Secondary | ICD-10-CM | POA: Diagnosis not present

## 2016-09-28 DIAGNOSIS — R079 Chest pain, unspecified: Secondary | ICD-10-CM | POA: Diagnosis not present

## 2016-10-08 DIAGNOSIS — D3500 Benign neoplasm of unspecified adrenal gland: Secondary | ICD-10-CM | POA: Diagnosis not present

## 2016-10-08 DIAGNOSIS — C678 Malignant neoplasm of overlapping sites of bladder: Secondary | ICD-10-CM | POA: Diagnosis not present

## 2016-10-08 DIAGNOSIS — N5201 Erectile dysfunction due to arterial insufficiency: Secondary | ICD-10-CM | POA: Diagnosis not present

## 2016-10-22 DIAGNOSIS — R202 Paresthesia of skin: Secondary | ICD-10-CM | POA: Diagnosis not present

## 2016-10-23 ENCOUNTER — Encounter: Payer: Self-pay | Admitting: Neurology

## 2017-01-04 DIAGNOSIS — Z0000 Encounter for general adult medical examination without abnormal findings: Secondary | ICD-10-CM | POA: Diagnosis not present

## 2017-01-04 DIAGNOSIS — Z125 Encounter for screening for malignant neoplasm of prostate: Secondary | ICD-10-CM | POA: Diagnosis not present

## 2017-01-04 DIAGNOSIS — N390 Urinary tract infection, site not specified: Secondary | ICD-10-CM | POA: Diagnosis not present

## 2017-01-08 ENCOUNTER — Other Ambulatory Visit: Payer: Self-pay | Admitting: Internal Medicine

## 2017-01-08 DIAGNOSIS — Z0001 Encounter for general adult medical examination with abnormal findings: Secondary | ICD-10-CM | POA: Diagnosis not present

## 2017-01-08 DIAGNOSIS — E049 Nontoxic goiter, unspecified: Secondary | ICD-10-CM

## 2017-01-08 DIAGNOSIS — Z7289 Other problems related to lifestyle: Secondary | ICD-10-CM | POA: Diagnosis not present

## 2017-02-01 ENCOUNTER — Ambulatory Visit: Payer: BLUE CROSS/BLUE SHIELD | Admitting: Neurology

## 2017-02-05 DIAGNOSIS — R972 Elevated prostate specific antigen [PSA]: Secondary | ICD-10-CM | POA: Diagnosis not present

## 2017-02-10 ENCOUNTER — Encounter: Payer: Self-pay | Admitting: Neurology

## 2017-02-10 ENCOUNTER — Ambulatory Visit (INDEPENDENT_AMBULATORY_CARE_PROVIDER_SITE_OTHER): Payer: BLUE CROSS/BLUE SHIELD | Admitting: Neurology

## 2017-02-10 VITALS — BP 140/88 | HR 97 | Ht 71.0 in | Wt 255.1 lb

## 2017-02-10 DIAGNOSIS — M5417 Radiculopathy, lumbosacral region: Secondary | ICD-10-CM | POA: Diagnosis not present

## 2017-02-10 DIAGNOSIS — R202 Paresthesia of skin: Secondary | ICD-10-CM | POA: Diagnosis not present

## 2017-02-10 NOTE — Patient Instructions (Addendum)
1.  NCS/EMG left leg 2.  Check labs  We will call you with the results

## 2017-02-10 NOTE — Progress Notes (Signed)
[facility] Clinic Note - Initial Visit   Date: 02/10/17  RANDLE SHATZER MRN: [id number] DOB: Apr 04, 1950   Dear Dr. Shelia Media:  Thank you for your kind referral of Gerald Morales for consultation of left foot tingling. Although his history is well known to you, please allow [place] to reiterate it for the purpose of our medical record. The patient was accompanied to the clinic by self.    History of Present Illness: Gerald Morales is a 66 y.o. right-handed Caucasian male with hypertension, hyperlipidemia, bilateral PE on anticoaulation, history of bladder cancer, and OA presenting for evaluation of left leg tingling.    Starting around the summer of 2018, he began noticing tingling over the sole of his left foot and occasionally radiates up his leg.  Symptoms occurs several times per week.  He has not identified any triggers such as activity or climbing steps.  Nothing alleviates the symptoms.  Duration can vary from hours to all day. He denies any weakness or low back pain. He denies any similar symptoms of the right foot.    Past Medical History:  Diagnosis Date  . Cancer ([place])    bladder  . Hyperlipidemia   . Hypertension   . PE (pulmonary thromboembolism) ([place])     Past Surgical History:  Procedure Laterality Date  . FRACTURE SURGERY    . HERNIA REPAIR       Medications:  Outpatient Encounter Medications as of 02/10/2017  Medication Sig  . losartan-hydrochlorothiazide (HYZAAR) 100-25 MG per tablet Take 1 tablet by mouth daily.   . rivaroxaban (XARELTO) 20 MG TABS tablet Take 20 mg by mouth daily.   . rosuvastatin (CRESTOR) 20 MG tablet Take 10 mg by mouth daily.    No facility-administered encounter medications on file as of 02/10/2017.      Allergies:  Allergies  Allergen Reactions  . Lipitor [Atorvastatin] Anxiety and Other (See Comments)    Patient stated,"anxiety, increased heart rate, nervousness."  . Vytorin  [Ezetimibe-Simvastatin] Anxiety and Other (See Comments)    Patient stated,"anxiety, increased heart rate, nervousness."    Family History: Family History  Problem Relation Age of Onset  . Hypertension Mother   . Diabetes Mother   . Hypertension Father     Social History: Social History   Tobacco Use  . Smoking status: Former Research scientist (life sciences)  . Smokeless tobacco: Former Network engineer Use Topics  . Alcohol use: Yes    Alcohol/week: 0.0 oz    Comment: 1-2 beers nightly  . 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     Review of Systems:  CONSTITUTIONAL: No fevers, chills, night sweats, or weight loss.   EYES: No visual changes or eye pain ENT: No hearing changes.  No history of nose bleeds.   RESPIRATORY: No cough, wheezing and shortness of breath.   CARDIOVASCULAR: Negative for chest pain, and palpitations.   GI: Negative for abdominal discomfort, blood in stools or black stools.  No recent change in bowel habits.   GU:  No history of incontinence.   MUSCLOSKELETAL: No history of joint pain or swelling.  No myalgias.   SKIN: Negative for lesions, rash, and itching.   HEMATOLOGY/ONCOLOGY: Negative for prolonged bleeding, bruising easily, and swollen nodes.  No history of cancer.   ENDOCRINE: Negative for cold or heat intolerance, polydipsia or goiter.   PSYCH:  No depression or anxiety symptoms.  NEURO: As Above.   Vital Signs:  BP 140/88   Pulse 97   Ht 5\' 11"  (1.803 m)   Wt 255 lb 2 oz (115.7 kg)   SpO2 97%   BMI 35.58 kg/m    General Medical Exam:   General:  Well appearing, comfortable.   Eyes/ENT: see cranial nerve examination.   Neck: No masses appreciated.  Full range of motion without tenderness.  No carotid bruits. Respiratory:  Clear to auscultation, good air entry bilaterally.   Cardiac:  Regular rate and rhythm, no murmur.   Extremities:  No deformities, edema, or skin discoloration.  Skin:   No rashes or lesions.  Neurological Exam: MENTAL STATUS including orientation to time, place, person, recent and remote memory, attention span and concentration, language, and fund of knowledge is normal.  Speech is not dysarthric.  CRANIAL NERVES: II:  No visual field defects.  Unremarkable fundi.   III-IV-VI: Pupils equal round and reactive to light.  Normal conjugate, extra-ocular eye movements in all directions of gaze.  No nystagmus.  No ptosis.   V:  Normal facial sensation.     VII:  Normal facial symmetry and movements.    VIII:  Normal hearing and vestibular function.   IX-X:  Normal palatal movement.   XI:  Normal shoulder shrug and head rotation.   XII:  Normal tongue strength and range of motion, no deviation or fasciculation.  MOTOR:  No atrophy, fasciculations or abnormal movements.  No pronator drift.  Tone is normal.    Right Upper Extremity:    Left Upper Extremity:    Deltoid  5/5   Deltoid  5/5   Biceps  5/5   Biceps  5/5   Triceps  5/5   Triceps  5/5   Wrist extensors  5/5   Wrist extensors  5/5   Wrist flexors  5/5   Wrist flexors  5/5   Finger extensors  5/5   Finger extensors  5/5   Finger flexors  5/5   Finger flexors  5/5   Dorsal interossei  5/5   Dorsal interossei  5/5   Abductor pollicis  5/5   Abductor pollicis  5/5   Tone (Ashworth scale)  0  Tone (Ashworth scale)  0   Right Lower Extremity:    Left Lower Extremity:    Hip flexors  5/5   Hip flexors  5/5   Hip extensors  5/5   Hip extensors  5/5   Knee flexors  5/5   Knee flexors  5/5   Knee extensors  5/5   Knee extensors  5/5   Dorsiflexors  5/5   Dorsiflexors  5/5   Plantarflexors  5/5   Plantarflexors  5/5   Toe extensors  5/5   Toe extensors  5/5   Toe flexors  5/5   Toe flexors  5/5   Tone (Ashworth scale)  0  Tone (Ashworth scale)  0   MSRs:  Right                                                                 Left brachioradialis 2+  brachioradialis 2+  biceps 2+  biceps 2+  triceps  2+  triceps 2+  patellar 2+  patellar 2+  ankle jerk 2+  ankle jerk 0  Hoffman no  Hoffman no  plantar response down  plantar response down   SENSORY:  Mildly reduced vibration only at the left great toe (20%).  Temperature and pin prick is intact.  .  Romberg's sign absent.   COORDINATION/GAIT: Normal finger-to- nose-finger and heel-to-shin.  Intact rapid alternating movements bilaterally.  Able to rise from a chair without using arms.  Gait narrow based and stable. Unsteady with tandem gait.  Stressed gait intact.   IMPRESSION: This is a 66 year-old man referred for evaluation of left foot paresthesias.  His exam shows mildly diminished vibration at the left great toe and absent patella reflex. Although he does not complain of low back or radicular pain, S1 sensory radiculopathy is suspected.  He does not have bilateral symptoms making neuropathy less likely.  Check vitamin B12, TSH for secondary causes of paresthesias.  Proceed with NCS/EMG of the leg to better localize symptoms.  If there is evidence of S1 radiculopathy, recommend low back strengthening and stretching as the next step.  Thank you for allowing me to participate in patient's care.  If I can answer any additional questions, I would be pleased to do so.    Sincerely,    Donika K. Posey Pronto, DO

## 2017-02-12 ENCOUNTER — Other Ambulatory Visit: Payer: BLUE CROSS/BLUE SHIELD

## 2017-02-12 DIAGNOSIS — M5417 Radiculopathy, lumbosacral region: Secondary | ICD-10-CM

## 2017-02-12 DIAGNOSIS — R202 Paresthesia of skin: Secondary | ICD-10-CM | POA: Diagnosis not present

## 2017-02-13 LAB — TSH: TSH: 0.68 mIU/L (ref 0.40–4.50)

## 2017-02-13 LAB — VITAMIN B12: Vitamin B-12: 221 pg/mL (ref 200–1100)

## 2017-02-15 ENCOUNTER — Telehealth: Payer: Self-pay | Admitting: *Deleted

## 2017-02-15 NOTE — Telephone Encounter (Signed)
Gave patient results and instructions.

## 2017-02-15 NOTE — Telephone Encounter (Signed)
-----   Message from Alda Berthold, DO sent at 02/15/2017 10:25 AM EST ----- Please inform pt that his vitamin B12 is low-normal and I recommend starting vitamin B12 1069mcg daily OTC. Thanks.

## 2017-03-04 ENCOUNTER — Encounter: Payer: BLUE CROSS/BLUE SHIELD | Admitting: Neurology

## 2017-04-26 ENCOUNTER — Encounter (HOSPITAL_COMMUNITY): Payer: Self-pay | Admitting: Emergency Medicine

## 2017-04-26 ENCOUNTER — Emergency Department (HOSPITAL_COMMUNITY)
Admission: EM | Admit: 2017-04-26 | Discharge: 2017-04-27 | Disposition: A | Discharge status: Home / Self Care | Payer: BLUE CROSS/BLUE SHIELD | Attending: Emergency Medicine | Admitting: Emergency Medicine

## 2017-04-26 ENCOUNTER — Emergency Department (HOSPITAL_COMMUNITY): Payer: BLUE CROSS/BLUE SHIELD

## 2017-04-26 DIAGNOSIS — I10 Essential (primary) hypertension: Secondary | ICD-10-CM | POA: Diagnosis not present

## 2017-04-26 DIAGNOSIS — R079 Chest pain, unspecified: Secondary | ICD-10-CM | POA: Diagnosis not present

## 2017-04-26 DIAGNOSIS — Z79899 Other long term (current) drug therapy: Secondary | ICD-10-CM | POA: Diagnosis not present

## 2017-04-26 DIAGNOSIS — Z87891 Personal history of nicotine dependence: Secondary | ICD-10-CM | POA: Diagnosis not present

## 2017-04-26 DIAGNOSIS — R0789 Other chest pain: Secondary | ICD-10-CM | POA: Insufficient documentation

## 2017-04-26 DIAGNOSIS — Z8551 Personal history of malignant neoplasm of bladder: Secondary | ICD-10-CM | POA: Insufficient documentation

## 2017-04-26 DIAGNOSIS — Z7901 Long term (current) use of anticoagulants: Secondary | ICD-10-CM | POA: Diagnosis not present

## 2017-04-26 LAB — BASIC METABOLIC PANEL
Anion gap: 11 (ref 5–15)
BUN: 16 mg/dL (ref 6–20)
CO2: 23 mmol/L (ref 22–32)
CREATININE: 1.02 mg/dL (ref 0.61–1.24)
Calcium: 9.1 mg/dL (ref 8.9–10.3)
Chloride: 104 mmol/L (ref 101–111)
Glucose, Bld: 98 mg/dL (ref 65–99)
Potassium: 3.3 mmol/L — ABNORMAL LOW (ref 3.5–5.1)
SODIUM: 138 mmol/L (ref 135–145)

## 2017-04-26 LAB — CBC
HCT: 45 % (ref 39.0–52.0)
Hemoglobin: 16.1 g/dL (ref 13.0–17.0)
MCH: 31.3 pg (ref 26.0–34.0)
MCHC: 35.8 g/dL (ref 30.0–36.0)
MCV: 87.4 fL (ref 78.0–100.0)
PLATELETS: 177 10*3/uL (ref 150–400)
RBC: 5.15 MIL/uL (ref 4.22–5.81)
RDW: 12.8 % (ref 11.5–15.5)
WBC: 6.2 10*3/uL (ref 4.0–10.5)

## 2017-04-26 LAB — I-STAT TROPONIN, ED: TROPONIN I, POC: 0.01 ng/mL (ref 0.00–0.08)

## 2017-04-26 NOTE — ED Provider Notes (Addendum)
[facility] DEPT Provider Note   CSN: [id number] Arrival date & time: 04/26/17  1350     History   Chief Complaint Chief Complaint  Patient presents with  . Chest Pain    HPI Gerald Morales is a 67 y.o. adult with a hx of bladder cancer, HTN, PE (on Xarelto), hyperlipidemia presents to the Emergency Department complaining of acute, now resolved left sided chest pain without radiation onset 12:30PM while at the [facility].  Pt reports he had associated diaphoresis.  All symptoms resolved within 5 minutes and have not returned.  Symptoms were not exertional.  Pt reports bladder cancer was 7 years ago and was cured with surgery alone.  Pt reports his PE was 4 years ago and he has not missed any doses of his Xarelto.  Pt reports he otherwise feels normal.  Nothing makes the symptoms better or worse.  Pt denies recent illness, travel, leg swelling.  Also denies fever, chills, headache, neck pain, SOB, abd pain, N/V/D, weakness, dizziness, syncope, dysuria.       The history is provided by the patient and medical records. No language interpreter was used.    Past Medical History:  Diagnosis Date  . Cancer ([place])    bladder  . Hyperlipidemia   . Hypertension   . PE (pulmonary thromboembolism) [facility])     Patient Active Problem List   Diagnosis Date Noted  . Hx of bladder cancer 06/02/2014  . Hx pulmonary embolism 06/02/2014  . Diverticulitis of colon 06/02/2014  . Erectile dysfunction 06/02/2014  . Essential hypertension 06/02/2014  . Hyperlipidemia 06/02/2014    Past Surgical History:  Procedure Laterality Date  . FRACTURE SURGERY    . HERNIA REPAIR      OB History    No data available       Home Medications    Prior to Admission medications   Medication Sig Start Date End Date Taking? Authorizing Provider  losartan-hydrochlorothiazide (HYZAAR) 100-25 MG per tablet Take 1 tablet by mouth daily.  10/31/13  Yes [provider]    rivaroxaban (XARELTO) 20 MG TABS tablet Take 20 mg by mouth daily.    Yes [provider]  rosuvastatin (CRESTOR) 20 MG tablet Take 10 mg by mouth daily.    Yes [provider]    Family History Family History  Problem Relation Age of Onset  . Hypertension Mother   . Diabetes Mother   . Hypertension Father     Social History Social History   Tobacco Use  . Smoking status: Former Research scientist (life sciences)  . Smokeless tobacco: Former Network engineer Use Topics  . Alcohol use: Yes    Alcohol/week: 0.0 oz    Comment: 1-2 beers nightly  . Drug use: No     Allergies   Lipitor [atorvastatin] and Vytorin [ezetimibe-simvastatin]   Review of Systems Review of Systems  Constitutional: Positive for diaphoresis (resolved). Negative for appetite change, fatigue, fever and unexpected weight change.  HENT: Negative for mouth sores.   Eyes: Negative for visual disturbance.  Respiratory: Negative for cough, chest tightness, shortness of breath and wheezing.   Cardiovascular: Positive for chest pain (resolved).  Gastrointestinal: Negative for abdominal pain, constipation, diarrhea, nausea and vomiting.  Endocrine: Negative for polydipsia, polyphagia and polyuria.  Genitourinary: Negative for dysuria, frequency, hematuria and urgency.  Musculoskeletal: Negative for back pain and neck stiffness.  Skin: Negative for rash.  Allergic/Immunologic: Negative for immunocompromised state.  Neurological: Negative for syncope, light-headedness and headaches.  Hematological: Does not bruise/bleed easily.  Psychiatric/Behavioral: Negative for sleep disturbance. The patient is not nervous/anxious.      Physical Exam Updated Vital Signs BP (!) 142/82 (BP Location: Left Arm)   Pulse 86   Temp 97.9 F (36.6 C) (Oral)   Resp 16   SpO2 93%   Physical Exam  Constitutional: He appears well-developed and well-nourished. No distress.  Awake, alert, nontoxic appearance  HENT:  Head: Normocephalic  and atraumatic.  Mouth/Throat: Oropharynx is clear and moist. No oropharyngeal exudate.  Eyes: Conjunctivae are normal. No scleral icterus.  Neck: Normal range of motion. Neck supple.  Cardiovascular: Normal rate, regular rhythm and intact distal pulses.  Pulmonary/Chest: Effort normal and breath sounds normal. No respiratory distress. He has no wheezes.  Equal chest expansion  Abdominal: Soft. Bowel sounds are normal. He exhibits no mass. There is no tenderness. There is no rebound and no guarding.  Musculoskeletal: Normal range of motion. He exhibits no edema.  Neurological: He is alert.  Speech is clear and goal oriented Moves extremities without ataxia  Skin: Skin is warm and dry. He is not diaphoretic.  Psychiatric: He has a normal mood and affect.  Nursing note and vitals reviewed.    ED Treatments / Results  Labs (all labs ordered are listed, but only abnormal results are displayed) Labs Reviewed  BASIC METABOLIC PANEL - Abnormal; Notable for the following components:      Result Value   Potassium 3.3 (*)    All other components within normal limits  CBC  TROPONIN I  D-DIMER, QUANTITATIVE (NOT AT [facility])  I-STAT TROPONIN, ED    ED ECG REPORT   Date: 04/27/2017  Rate: 82  Rhythm: normal sinus rhythm  QRS Axis: normal  Intervals: normal  ST/T Wave abnormalities: nonspecific T wave changes  Conduction Disutrbances:none  Narrative Interpretation:   Old EKG Reviewed: unchanged   I have personally reviewed the EKG tracing and agree with the computerized printout as noted.   EKG  EKG Interpretation  Date/Time:  Monday April 26 2017 23:41:25 EST Ventricular Rate:  81 PR Interval:    QRS Duration: 84 QT Interval:  365 QTC Calculation: 424 R Axis:   68 Text Interpretation:  Sinus rhythm No significant change was found Confirmed by Ezequiel Essex [id number]) on 04/26/2017 11:44:35 PM       Radiology Dg Chest 2 View  Result Date: 04/26/2017 CLINICAL DATA:   Left-sided chest pain beginning today. History of PE. Ex-smoker. EXAM: CHEST  2 VIEW COMPARISON:  01/02/2016 FINDINGS: Cardiac silhouette is normal in size and configuration. No mediastinal or hilar masses. No evidence of adenopathy. Clear lungs.  No pleural effusion or pneumothorax. Skeletal structures are intact. IMPRESSION: No active cardiopulmonary disease. Electronically Signed   By: Lajean Manes M.D.   On: 04/26/2017 16:33    Procedures Procedures (including critical care time)  Medications Ordered in ED Medications - No data to display   Initial Impression / Assessment and Plan / ED Course  I have reviewed the triage vital signs and the nursing notes.  Pertinent labs & imaging results that were available during my care of the patient were reviewed by me and considered in my medical decision making (see chart for details).  Clinical Course as of Apr 27 100  Mon Apr 26, 2017  2324 Initially hypertensive BP: (!) 165/98 [HM]  2324 Mild Potassium: (!) 3.3 [HM]  Tue Apr 27, 2017  0102 Heart Score 3  [HM]  0102 HTN resolved without  intervention BP: 132/84 [HM]    Clinical Course User Index [HM] Kirstin Kugler, Jarrett Soho, PA-C    Pt with atypical chest pain that lasted less than 45min and has not returned.  VSS, no tracheal deviation, no JVD or new murmur, RRR, breath sounds equal bilaterally, initial EKG and repeat ECG without acute abnormalities, negative troponin x2, and negative CXR.  She does have a history of PE.  This was 4 years ago and he has been on Xarelto ever since.  Patient reports he has not missed any of his doses.  No leg swelling or symptoms of DVT.  He had no shortness of breath today.  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  Patient reports stress test previously but he cannot remember when.  Discussed with patient importance of follow-up with cardiology for repeat stress test and further evaluation.  Pt has been advised to return to the ED if CP becomes  exertional, associated with diaphoresis or nausea, radiates to left jaw/arm, worsens or becomes concerning in any way. Pt appears reliable for follow up and is agreeable to discharge.   Case has been discussed with and seen by Dr. Wyvonnia Dusky who agrees with the above plan to discharge.   Final Clinical Impressions(s) / ED Diagnoses   Final diagnoses:  Left sided chest pain  Nonspecific chest pain    ED Discharge Orders    None         Loni Muse Gwenlyn Perking 04/27/17 0102    Ezequiel Essex, MD 04/27/17 [phone]

## 2017-04-26 NOTE — ED Triage Notes (Signed)
Patient here from with complaints of chest pain that started suddenly when at the [facility]. Reports that he all of a sudden became dizzy and diaphoretic. Hx of hypertension.

## 2017-04-27 LAB — TROPONIN I

## 2017-04-27 LAB — D-DIMER, QUANTITATIVE (NOT AT ARMC): D DIMER QUANT: 0.27 ug{FEU}/mL (ref 0.00–0.50)

## 2017-04-27 NOTE — Discharge Instructions (Signed)
1. Medications: usual home medications 2. Treatment: rest, drink plenty of fluids,  3. Follow Up: Please followup with your primary doctor in 2-3 days for discussion of your diagnoses and further evaluation after today's visit; Please also follow up with Cardiology for stress test; Please return to the ER for return of chest pain, Shortness of breath or other concerns

## 2017-05-14 DIAGNOSIS — M5412 Radiculopathy, cervical region: Secondary | ICD-10-CM | POA: Diagnosis not present

## 2017-06-15 DIAGNOSIS — R079 Chest pain, unspecified: Secondary | ICD-10-CM | POA: Insufficient documentation

## 2017-06-15 NOTE — Progress Notes (Signed)
Cardiology Office Note    Date:  06/16/2017   ID:  Gerald Morales, DOB 1950/07/07, MRN [id number]  PCP:  Deland Pretty, MD  Cardiologist: New  No chief complaint on file.   History of Present Illness:  Gerald Morales is a 67 y.o. adult with history of PE on Xarelto, HTN, HLD, bladder CA.  Was seen here by Dr. Stanford Breed in 2008 and had a stress test. Patient in the ER 04/26/17 with left sided chest pain associated with brief diaphoresis lasting 15-20 secs. Troponins negative, EKG nonischemic, low suspicion for PE as he was compliant with Xarelto. Recommended outpatient stress test.  Patient says he was at [facility] and had a severe left sided chest tightness that lasted 20 sec. No pain since. Walks 1/2 mile in a day but no sustained walking. No family history of CAD. Quit smoking 5 yrs ago. Works as a Diplomatic Services operational officer.Drinks 2-3, 24 ounce beers daily.  Has gained a lot of weight over the past several years.   Past Medical History:  Diagnosis Date  . Cancer ([place])    bladder  . Hyperlipidemia   . Hypertension   . PE (pulmonary thromboembolism) ([place])     Past Surgical History:  Procedure Laterality Date  . FRACTURE SURGERY    . HERNIA REPAIR      Current Medications: No outpatient medications have been marked as taking for the 06/16/17 encounter (Office Visit) with Imogene Burn, PA-C.     Allergies:   Lipitor [atorvastatin] and Vytorin [ezetimibe-simvastatin]   Social History   Socioeconomic History  . Marital status: Married    Spouse name: Not on file  . Number of children: Not on file  . Years of education: Not on file  . Highest education level: Not on file  Occupational History  . Not on file  Social Needs  . Financial resource strain: Not on file  . Food insecurity:    Worry: Not on file    Inability: Not on file  . Transportation needs:    Medical: Not on file    Non-medical: Not on file  Tobacco Use  . Smoking status: Former Research scientist (life sciences)  . Smokeless  tobacco: Former Network engineer and Sexual Activity  . Alcohol use: Yes    Alcohol/week: 0.0 oz    Comment: 1-2 beers nightly  . Drug use: No  . Sexual activity: Never  Lifestyle  . Physical activity:    Days per week: Not on file    Minutes per session: Not on file  . Stress: Not on file  Relationships  . Social connections:    Talks on phone: Not on file    Gets together: Not on file    Attends religious service: Not on file    Active member of club or organization: Not on file    Attends meetings of clubs or organizations: Not on file    Relationship status: Not on file  Other Topics Concern  . 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      Family History:  The patient's family history includes Diabetes in his mother; Hypertension in his father and mother.   ROS:   Please see the history of present illness.    Review of Systems  Constitution: Negative.  HENT: Negative.   Cardiovascular: Positive for chest pain.  Respiratory: Positive for cough and snoring.   Endocrine: Negative.   Hematologic/Lymphatic: Negative.  Musculoskeletal: Positive for myalgias.  Gastrointestinal: Negative.   Genitourinary: Negative.   Neurological: Negative.    All other systems reviewed and are negative.   PHYSICAL EXAM:   VS:  BP 140/90   Pulse 90   Ht 5\' 11"  (1.803 m)   Wt 260 lb (117.9 kg)   SpO2 97%   BMI 36.26 kg/m   Physical Exam  GEN: Obese, in no acute distress  Neck: no JVD, carotid bruits, or masses Cardiac:RRR; positive S4, no murmurs,or gallops  Respiratory:  clear to auscultation bilaterally, normal work of breathing GI: soft, nontender, nondistended, + BS Ext: without cyanosis, clubbing, or edema, Good distal pulses bilaterally Neuro:  Alert and Oriented x 3 Psych: euthymic mood, full affect  Wt Readings from Last 3 Encounters:  06/16/17 260 lb (117.9 kg)  02/10/17 255 lb 2 oz (115.7 kg)  08/12/16 248 lb 3.2 oz  (112.6 kg)      Studies/Labs Reviewed:   EKG:  EKG is not ordered today.  The ekg reviewed from 04/27/17 normal sinus rhythm, normal EKG Recent Labs: 02/12/2017: TSH 0.68 04/26/2017: BUN 16; Creatinine, Ser 1.02; Hemoglobin 16.1; Platelets 177; Potassium 3.3; Sodium 138   Lipid Panel    Component Value Date/Time   CHOL (H) 10/05/2006 0423    247        ATP III CLASSIFICATION:  <200     mg/dL   Desirable  200-239  mg/dL   Borderline High  >=240    mg/dL   High   TRIG 302 (H) 10/05/2006 0423   HDL 36 (L) 10/05/2006 0423   CHOLHDL 6.9 10/05/2006 0423   VLDL 60 (H) 10/05/2006 0423   LDLCALC (H) 10/05/2006 0423    151        Total Cholesterol/HDL:CHD Risk Coronary Heart Disease Risk Table                     Men   Women  1/2 Average Risk   3.4   3.3    Additional studies/ records that were reviewed today include:     ASSESSMENT:    1. Chest pain, unspecified type   2. Hx pulmonary embolism   3. Essential hypertension   4. Hyperchylomicronemia   5. Hx of bladder cancer      PLAN:  In order of problems listed above:  Chest pain brief lasting 15-30 seconds and has not occurred since.  Troponins negative EKG normal.  Does have multiple risk factors for CAD.  Discussed with Dr. Caryl Comes who recommends coronary CT.  History of PE on Xarelto  Hypertension blood pressure elevated today.  Patient says blood pressures been stable at home.  Does eat a lot of TV dinners since his wife cannot cook anymore.  Recommend 2 g sodium diet.  Monitor blood pressures at home and if staying over 135/85 he will need another blood pressure agent.  He sees Dr. Lavonna Monarch this afternoon as well.  HLD does not know when his last lipid panel was checked.  He does take Crestor.  Last records I have are from 2008.Will order.  History of bladder CA    Medication Adjustments/Labs and Tests Ordered: Current medicines are reviewed at length with the patient today.  Concerns regarding medicines are outlined  above.  Medication changes, Labs and Tests ordered today are listed in the Patient Instructions below. There are no Patient Instructions on file for this visit.   Sumner Boast, PA-C  06/16/2017 9:36 AM  [facility] [address], [place], [place]  [address] Phone: [phone]; Fax: [phone]

## 2017-06-16 ENCOUNTER — Encounter: Payer: Self-pay | Admitting: Physician Assistant

## 2017-06-16 ENCOUNTER — Ambulatory Visit: Payer: BLUE CROSS/BLUE SHIELD | Admitting: Physician Assistant

## 2017-06-16 VITALS — BP 140/90 | HR 90 | Ht 71.0 in | Wt 260.0 lb

## 2017-06-16 DIAGNOSIS — E783 Hyperchylomicronemia: Secondary | ICD-10-CM

## 2017-06-16 DIAGNOSIS — R079 Chest pain, unspecified: Secondary | ICD-10-CM | POA: Diagnosis not present

## 2017-06-16 DIAGNOSIS — D485 Neoplasm of uncertain behavior of skin: Secondary | ICD-10-CM | POA: Diagnosis not present

## 2017-06-16 DIAGNOSIS — Z86711 Personal history of pulmonary embolism: Secondary | ICD-10-CM

## 2017-06-16 DIAGNOSIS — Z8551 Personal history of malignant neoplasm of bladder: Secondary | ICD-10-CM | POA: Diagnosis not present

## 2017-06-16 DIAGNOSIS — I10 Essential (primary) hypertension: Secondary | ICD-10-CM | POA: Diagnosis not present

## 2017-06-16 DIAGNOSIS — Z6835 Body mass index (BMI) 35.0-35.9, adult: Secondary | ICD-10-CM | POA: Diagnosis not present

## 2017-06-16 MED ORDER — METOPROLOL TARTRATE 50 MG PO TABS: ORAL_TABLET | ORAL | 0 refills | Status: DC

## 2017-06-16 NOTE — Patient Instructions (Addendum)
Medication Instructions:  Your physician recommends that you continue on your current medications as directed. Please refer to the Current Medication list given to you today.   Labwork: Your physician recommends that you return for a FASTING lipid profile and basic metabolic panel   Testing/Procedures: Your physician has requested that you have cardiac CT. Cardiac computed tomography (CT) is a painless test that uses an x-ray machine to take clear, detailed pictures of your heart. For further information please visit HugeFiesta.[email or web address]. Please follow instruction sheet as given.  Follow-Up: Your physician wants you to follow-up with Ermalinda Barrios, PA after Cardiac CT.   Any Other Special Instructions Will Be Listed Below (If Applicable).  - YOU SHOULD GET 150 MINUTES OF EXERCISE PER WEEK  - DECREASE YOUR ALCOHOL INTAKE  - MONITOR YOUR BLOOD PRESSURE AT HOME AND CALL IF IT CONSISTENTLY GREATER THAN 135/85  - LIMIT THE AMOUNT OF SODIUM IN YOUR DIET TO 2 GRAMS DAILY     Cardiac CT Instructions  Please arrive at the [facility] main entrance of [facility] on ______ at ______ AM (30-45 minutes prior to test start time)  [facility] [place], [place] [address] [phone]  Proceed to the [facility] Radiology Department (First Floor).  Please follow these instructions carefully (unless otherwise directed):  Hold all erectile dysfunction medications at least 48 hours prior to test.  On the Night Before the Test: . Drink plenty of water. . Do not consume any caffeinated/decaffeinated beverages or chocolate 12 hours prior to your test. . Do not take any antihistamines 12 hours prior to your test.   On the Day of the Test: . Drink plenty of water. Do not drink any water within one hour of the test. . Do not eat any food 4 hours prior to the test. . You may take your regular medications prior to the test. . IF NOT ON A BETA BLOCKER - Take  50 mg of lopressor (metoprolol) one hour before the test.   After the Test: . Drink plenty of water. . After receiving IV contrast, you may experience a mild flushed feeling. This is normal. . On occasion, you may experience a mild rash up to 24 hours after the test. This is not dangerous. If this occurs, you can take Benadryl 25 mg and increase your fluid intake. . If you experience trouble breathing, this can be serious. If it is severe call 911 IMMEDIATELY. If it is mild, please call our office.   Low-Sodium Eating Plan Sodium, which is an element that makes up salt, helps you maintain a healthy balance of fluids in your body. Too much sodium can increase your blood pressure and cause fluid and waste to be held in your body. Your health care provider or dietitian may recommend following this plan if you have high blood pressure (hypertension), kidney disease, liver disease, or heart failure. Eating less sodium can help lower your blood pressure, reduce swelling, and protect your heart, liver, and kidneys. What are tips for following this plan? General guidelines  Most people on this plan should limit their sodium intake to 1,500-2,000 mg (milligrams) of sodium each day. Reading food labels  The Nutrition Facts label lists the amount of sodium in one serving of the food. If you eat more than one serving, you must multiply the listed amount of sodium by the number of servings.  Choose foods with less than 140 mg of sodium per serving.  Avoid foods with  300 mg of sodium or more per serving. Shopping  Look for lower-sodium products, often labeled as "low-sodium" or "no salt added."  Always check the sodium content even if foods are labeled as "unsalted" or "no salt added".  Buy fresh foods. ? Avoid canned foods and premade or frozen meals. ? Avoid canned, cured, or processed meats  Buy breads that have less than 80 mg of sodium per slice. Cooking  Eat more home-cooked food and  less restaurant, buffet, and fast food.  Avoid adding salt when cooking. Use salt-free seasonings or herbs instead of table salt or sea salt. Check with your health care provider or pharmacist before using salt substitutes.  Cook with plant-based oils, such as canola, sunflower, or olive oil. Meal planning  When eating at a restaurant, ask that your food be prepared with less salt or no salt, if possible.  Avoid foods that contain MSG (monosodium glutamate). MSG is sometimes added to [place] food, bouillon, and some canned foods. What foods are recommended? The items listed may not be a complete list. Talk with your dietitian about what dietary choices are best for you. Grains Low-sodium cereals, including oats, puffed wheat and rice, and shredded wheat. Low-sodium crackers. Unsalted rice. Unsalted pasta. Low-sodium bread. Whole-grain breads and whole-grain pasta. Vegetables Fresh or frozen vegetables. "No salt added" canned vegetables. "No salt added" tomato sauce and paste. Low-sodium or reduced-sodium tomato and vegetable juice. Fruits Fresh, frozen, or canned fruit. Fruit juice. Meats and other protein foods Fresh or frozen (no salt added) meat, poultry, seafood, and fish. Low-sodium canned tuna and salmon. Unsalted nuts. Dried peas, beans, and lentils without added salt. Unsalted canned beans. Eggs. Unsalted nut butters. Dairy Milk. Soy milk. Cheese that is naturally low in sodium, such as ricotta cheese, fresh mozzarella, or Swiss cheese Low-sodium or reduced-sodium cheese. Cream cheese. Yogurt. Fats and oils Unsalted butter. Unsalted margarine with no trans fat. Vegetable oils such as canola or olive oils. Seasonings and other foods Fresh and dried herbs and spices. Salt-free seasonings. Low-sodium mustard and ketchup. Sodium-free salad dressing. Sodium-free light mayonnaise. Fresh or refrigerated horseradish. Lemon juice. Vinegar. Homemade, reduced-sodium, or low-sodium soups.  Unsalted popcorn and pretzels. Low-salt or salt-free chips. What foods are not recommended? The items listed may not be a complete list. Talk with your dietitian about what dietary choices are best for you. Grains Instant hot cereals. Bread stuffing, pancake, and biscuit mixes. Croutons. Seasoned rice or pasta mixes. Noodle soup cups. Boxed or frozen macaroni and cheese. Regular salted crackers. Self-rising flour. Vegetables Sauerkraut, pickled vegetables, and relishes. Olives. [place] fries. Onion rings. Regular canned vegetables (not low-sodium or reduced-sodium). Regular canned tomato sauce and paste (not low-sodium or reduced-sodium). Regular tomato and vegetable juice (not low-sodium or reduced-sodium). Frozen vegetables in sauces. Meats and other protein foods Meat or fish that is salted, canned, smoked, spiced, or pickled. Bacon, ham, sausage, hotdogs, corned beef, chipped beef, packaged lunch meats, salt pork, jerky, pickled herring, anchovies, regular canned tuna, sardines, salted nuts. Dairy Processed cheese and cheese spreads. Cheese curds. Blue cheese. Feta cheese. String cheese. Regular cottage cheese. Buttermilk. Canned milk. Fats and oils Salted butter. Regular margarine. Ghee. Bacon fat. Seasonings and other foods Onion salt, garlic salt, seasoned salt, table salt, and sea salt. Canned and packaged gravies. Worcestershire sauce. Tartar sauce. Barbecue sauce. Teriyaki sauce. Soy sauce, including reduced-sodium. Steak sauce. Fish sauce. Oyster sauce. Cocktail sauce. Horseradish that you find on the shelf. Regular ketchup and mustard. Meat flavorings and tenderizers. Bouillon  cubes. Hot sauce and Tabasco sauce. Premade or packaged marinades. Premade or packaged taco seasonings. Relishes. Regular salad dressings. Salsa. Potato and tortilla chips. Corn chips and puffs. Salted popcorn and pretzels. Canned or dried soups. Pizza. Frozen entrees and pot pies. Summary  Eating less sodium can  help lower your blood pressure, reduce swelling, and protect your heart, liver, and kidneys.  Most people on this plan should limit their sodium intake to 1,500-2,000 mg (milligrams) of sodium each day.  Canned, boxed, and frozen foods are high in sodium. Restaurant foods, fast foods, and pizza are also very high in sodium. You also get sodium by adding salt to food.  Try to cook at home, eat more fresh fruits and vegetables, and eat less fast food, canned, processed, or prepared foods. This information is not intended to replace advice given to you by your health care provider. Make sure you discuss any questions you have with your health care provider. Document Released: 08/01/2001 Document Revised: 02/03/2016 Document Reviewed: 02/03/2016 Elsevier Interactive Patient Education  [employer].     If you need a refill on your cardiac medications before your next appointment, please call your pharmacy.

## 2017-07-05 ENCOUNTER — Telehealth: Payer: Self-pay | Admitting: Physician Assistant

## 2017-07-05 NOTE — Telephone Encounter (Signed)
New message    Patient states he misplaced his instructions for the Cardiac Ct , could you please resend them or call him with them.  Cardiac Ct scheduled for June 11 930a, coming in for lab work on June 5th.

## 2017-07-05 NOTE — Telephone Encounter (Signed)
Mailed pts instructions for his CT.

## 2017-07-28 ENCOUNTER — Other Ambulatory Visit: Payer: BLUE CROSS/BLUE SHIELD | Admitting: *Deleted

## 2017-07-28 DIAGNOSIS — R079 Chest pain, unspecified: Secondary | ICD-10-CM

## 2017-07-28 DIAGNOSIS — I10 Essential (primary) hypertension: Secondary | ICD-10-CM | POA: Diagnosis not present

## 2017-07-28 DIAGNOSIS — Z86711 Personal history of pulmonary embolism: Secondary | ICD-10-CM

## 2017-07-28 DIAGNOSIS — Z8551 Personal history of malignant neoplasm of bladder: Secondary | ICD-10-CM

## 2017-07-28 DIAGNOSIS — E783 Hyperchylomicronemia: Secondary | ICD-10-CM

## 2017-07-28 LAB — BASIC METABOLIC PANEL
BUN/Creatinine Ratio: 18 (ref 10–24)
BUN: 19 mg/dL (ref 8–27)
CHLORIDE: 105 mmol/L (ref 96–106)
CO2: 22 mmol/L (ref 20–29)
Calcium: 9.3 mg/dL (ref 8.6–10.2)
Creatinine, Ser: 1.03 mg/dL (ref 0.76–1.27)
GFR calc Af Amer: 85 mL/min/{1.73_m2} (ref >59)
GFR calc non Af Amer: 74 mL/min/{1.73_m2} (ref >59)
GLUCOSE: 111 mg/dL — AB (ref 65–99)
Potassium: 3.8 mmol/L (ref 3.5–5.2)
SODIUM: 141 mmol/L (ref 134–144)

## 2017-07-28 LAB — LIPID PANEL
Chol/HDL Ratio: 4.2 ratio (ref 0.0–5.0)
Cholesterol, Total: 162 mg/dL (ref 100–199)
HDL: 39 mg/dL — ABNORMAL LOW (ref >39)
LDL CALC: 90 mg/dL (ref 0–99)
Triglycerides: 167 mg/dL — ABNORMAL HIGH (ref 0–149)
VLDL CHOLESTEROL CAL: 33 mg/dL (ref 5–40)

## 2017-07-29 ENCOUNTER — Telehealth: Payer: Self-pay | Admitting: *Deleted

## 2017-07-29 DIAGNOSIS — Z79899 Other long term (current) drug therapy: Secondary | ICD-10-CM

## 2017-07-29 MED ORDER — ICOSAPENT ETHYL 1 G PO CAPS: 2.0000 g | ORAL_CAPSULE | Freq: Two times a day (BID) | ORAL | 6 refills | Status: DC

## 2017-07-29 NOTE — Telephone Encounter (Signed)
-----   Message from Imogene Burn, PA-C sent at 07/28/2017  3:56 PM EDT ----- Triglycerides elevated at 167 LDL a little high at 90.  Recommend Vascepa 2 g twice daily.  Repeat lipids in 3 months.

## 2017-07-30 ENCOUNTER — Telehealth: Payer: Self-pay

## 2017-07-30 NOTE — Telephone Encounter (Signed)
I have done a Vascepa PA through covermymeds. Key: [id number]

## 2017-08-03 ENCOUNTER — Ambulatory Visit (HOSPITAL_COMMUNITY)
Admission: RE | Admit: 2017-08-03 | Discharge: 2017-08-03 | Disposition: A | Discharge status: Home / Self Care | Payer: BLUE CROSS/BLUE SHIELD | Source: Ambulatory Visit | Attending: Physician Assistant | Admitting: Physician Assistant

## 2017-08-03 ENCOUNTER — Ambulatory Visit (HOSPITAL_COMMUNITY): Admission: RE | Admit: 2017-08-03 | Payer: BLUE CROSS/BLUE SHIELD | Source: Ambulatory Visit

## 2017-08-03 DIAGNOSIS — R079 Chest pain, unspecified: Secondary | ICD-10-CM | POA: Diagnosis not present

## 2017-08-03 DIAGNOSIS — I2510 Atherosclerotic heart disease of native coronary artery without angina pectoris: Secondary | ICD-10-CM | POA: Insufficient documentation

## 2017-08-03 DIAGNOSIS — R0789 Other chest pain: Secondary | ICD-10-CM | POA: Diagnosis not present

## 2017-08-03 DIAGNOSIS — I350 Nonrheumatic aortic (valve) stenosis: Secondary | ICD-10-CM | POA: Diagnosis not present

## 2017-08-03 MED ORDER — NITROGLYCERIN 0.4 MG SL SUBL
SUBLINGUAL_TABLET | SUBLINGUAL | Status: AC
  Filled 2017-08-03: qty 2

## 2017-08-03 MED ORDER — METOPROLOL TARTRATE 5 MG/5ML IV SOLN
INTRAVENOUS | Status: AC
  Filled 2017-08-03: qty 5

## 2017-08-03 MED ORDER — NITROGLYCERIN 0.4 MG SL SUBL
0.8000 mg | SUBLINGUAL_TABLET | Freq: Once | SUBLINGUAL | Status: AC
  Administered 2017-08-03: 0.8 mg via SUBLINGUAL
  Filled 2017-08-03: qty 25

## 2017-08-03 MED ORDER — METOPROLOL TARTRATE 5 MG/5ML IV SOLN
5.0000 mg | Freq: Once | INTRAVENOUS | Status: AC
  Administered 2017-08-03: 5 mg via INTRAVENOUS
  Filled 2017-08-03: qty 5

## 2017-08-03 MED ORDER — IOPAMIDOL (ISOVUE-370) INJECTION 76%
INTRAVENOUS | Status: AC
  Filled 2017-08-03: qty 50

## 2017-08-03 MED ORDER — IOPAMIDOL (ISOVUE-370) INJECTION 76%
INTRAVENOUS | Status: AC
  Administered 2017-08-03: 80 mL
  Filled 2017-08-03: qty 100

## 2017-08-16 NOTE — Progress Notes (Signed)
Cardiology Office Note    Date:  08/17/2017   ID:  Gerald Morales, DOB [birth year], MRN [id number]  PCP:  Gerald Pretty, MD  Cardiologist: No primary care provider on file.  Chief Complaint  Patient presents with  . Follow-up    History of Present Illness:  Gerald Morales is a 67 y.o. adult with history of PE on Xarelto, hypertension, HLD, bladder CA.  Stress test in 2008 normal.  Left-sided chest pain with brief diaphoresis 04/26/2017 troponins negative EKG nonischemic and recommended outpatient stress test.  I saw the patient 06/16/2017 and discussed with Dr. Caryl Morales who recommended coronary CT.  This was performed 08/03/2017 calcium score of 18 which was 25 percentile for age and sex match control, minimal nonobstructive CAD and trivial calcification of the aortic valve.  When I saw him his blood pressure is elevated they did and was eating a lot of TV dinners.  He wanted to cut back on sodium before starting another agent.  Triglycerides were high so we added Vascepa to his Crestor.  Patient Morales in today for follow-up.  Reviewed results of CT scan.  He has cut back on salt in his diet, stopped drinking beer, is decreasing carbs and trying to lose weight.  Still eaten some TV dinners but not as many.  Blood pressure is better.  He is lost 4 pounds.  No regular exercise.  No further chest pain.    Past Medical History:  Diagnosis Date  . Cancer ([place])    bladder  . Hyperlipidemia   . Hypertension   . PE (pulmonary thromboembolism) ([place])     Past Surgical History:  Procedure Laterality Date  . FRACTURE SURGERY    . HERNIA REPAIR      Current Medications: Current Meds  Medication Sig  . Icosapent Ethyl (VASCEPA) 1 g CAPS Take 2 capsules (2 g total) by mouth 2 (two) times daily.  Marland Kitchen losartan-hydrochlorothiazide (HYZAAR) 100-25 MG per tablet Take 1 tablet by mouth daily.   . rivaroxaban (XARELTO) 20 MG TABS tablet Take 20 mg by mouth daily.   . rosuvastatin (CRESTOR) 20 MG  tablet Take 10 mg by mouth daily.   . [DISCONTINUED] metoprolol tartrate (LOPRESSOR) 50 MG tablet Take 1 tablet (50 mg total) 1 hour prior to Cardiac CT     Allergies:   Lipitor [atorvastatin] and Vytorin [ezetimibe-simvastatin]   Social History   Socioeconomic History  . Marital status: Married    Spouse name: Not on file  . Number of children: Not on file  . Years of education: Not on file  . Highest education level: Not on file  Occupational History  . Not on file  Social Needs  . Financial resource strain: Not on file  . Food insecurity:    Worry: Not on file    Inability: Not on file  . Transportation needs:    Medical: Not on file    Non-medical: Not on file  Tobacco Use  . Smoking status: Former Research scientist (life sciences)  . Smokeless tobacco: Former Network engineer and Sexual Activity  . Alcohol use: Yes    Alcohol/week: 0.0 oz    Comment: 1-2 beers nightly  . Drug use: No  . Sexual activity: Never  Lifestyle  . Physical activity:    Days per week: Not on file    Minutes per session: Not on file  . Stress: Not on file  Relationships  . Social connections:    Talks on phone: Not on file  Gets together: Not on file    Attends religious service: Not on file    Active member of club or organization: Not on file    Attends meetings of clubs or organizations: Not on file    Relationship status: Not on file  Other Topics Concern  . 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      Family History:  The patient's family history includes Diabetes in his mother; Hypertension in his father and mother.   ROS:   Please see the history of present illness.    Review of Systems  Constitution: Negative.  HENT: Negative.   Cardiovascular: Negative.   Respiratory: Negative.   Endocrine: Negative.   Hematologic/Lymphatic: Negative.   Musculoskeletal: Negative.   Gastrointestinal: Negative.   Genitourinary: Negative.   Neurological: Negative.     All other systems reviewed and are negative.   PHYSICAL EXAM:   VS:  BP 130/70   Pulse 72   Ht 5\' 11"  (1.803 m)   Wt 256 lb 6.4 oz (116.3 kg)   SpO2 96%   BMI 35.76 kg/m   Physical Exam  GEN: Obese, in no acute distress  Neck: no JVD, carotid bruits, or masses Cardiac:RRR; no murmurs, rubs, or gallops  Respiratory:  clear to auscultation bilaterally, normal work of breathing GI: soft, nontender, nondistended, + BS Ext: without cyanosis, clubbing, or edema, Good distal pulses bilaterally Neuro:  Alert and Oriented x 3 Psych: euthymic mood, full affect  Wt Readings from Last 3 Encounters:  08/17/17 256 lb 6.4 oz (116.3 kg)  06/16/17 260 lb (117.9 kg)  02/10/17 255 lb 2 oz (115.7 kg)      Studies/Labs Reviewed:   EKG:  EKG is not ordered today.    Recent Labs: 02/12/2017: TSH 0.68 04/26/2017: Hemoglobin 16.1; Platelets 177 07/28/2017: BUN 19; Creatinine, Ser 1.03; Potassium 3.8; Sodium 141   Lipid Panel    Component Value Date/Time   CHOL 162 07/28/2017 0906   TRIG 167 (H) 07/28/2017 0906   HDL 39 (L) 07/28/2017 0906   CHOLHDL 4.2 07/28/2017 0906   CHOLHDL 6.9 10/05/2006 0423   VLDL 60 (H) 10/05/2006 0423   LDLCALC 90 07/28/2017 0906    Additional studies/ records that were reviewed today include:  Coronary CT 08/03/2017 IMPRESSION: 1. Coronary calcium score of 18. This was 25 percentile for age and sex matched control.   2. Normal coronary origin with right dominance.   3. Minimal non-obstructive CAD.   4. Trivial calcifications in the aortic valve.     Electronically Signed   By: Gerald Morales   On: 08/03/2017 13:14    ASSESSMENT:    1. Chest pain, unspecified type   2. Essential hypertension   3. Hx pulmonary embolism   4. Mixed hyperlipidemia      PLAN:  In order of problems listed above:   Chest pain atypical coronary CT calcium score 18 nonobstructive CAD.  No further chest pain.  No further work-up recommended.  Recommend heart  healthy diet, 20 to 30 minutes of exercise daily.  Essential hypertension has cut back on his sodium intake and blood pressure is better today.  Continue Hyzaar and metoprolol.  Follow-up with PCP for further management.  Follow-up with [place] PRN  History of PE on Xarelto  History of hyperlipidemia Vascepa added to Crestor for elevated triglycerides.  Will need repeat lipids in 6 weeks   Medication Adjustments/Labs and Tests Ordered:  Current medicines are reviewed at length with the patient today.  Concerns regarding medicines are outlined above.  Medication changes, Labs and Tests ordered today are listed in the Patient Instructions below. Patient Instructions  Medication Instructions:  Your physician recommends that you continue on your current medications as directed. Please refer to the Current Medication list given to you today.   Labwork: Keep FASTING lab appointment on 11/01/17  Testing/Procedures: None ordered  Follow-Up: Your physician wants you to follow-up AS NEEDED   Any Other Special Instructions Will Be Listed Below (If Applicable).  Be sure to get 20 minutes of exercise daily    Heart-Healthy Eating Plan Heart-healthy meal planning includes:  Limiting unhealthy fats.  Increasing healthy fats.  Making other small dietary changes.  You may need to talk with your doctor or a diet specialist (dietitian) to create an eating plan that is right for you. What types of fat should I choose?  Choose healthy fats. These include olive oil and canola oil, flaxseeds, walnuts, almonds, and seeds.  Eat more omega-3 fats. These include salmon, mackerel, sardines, tuna, flaxseed oil, and ground flaxseeds. Try to eat fish at least twice each week.  Limit saturated fats. ? Saturated fats are often found in animal products, such as meats, butter, and cream. ? Plant sources of saturated fats include palm oil, palm kernel oil, and coconut oil.  Avoid foods with partially  hydrogenated oils in them. These include stick margarine, some tub margarines, cookies, crackers, and other baked goods. These contain trans fats. What general guidelines do I need to follow?  Check food labels carefully. Identify foods with trans fats or high amounts of saturated fat.  Fill one half of your plate with vegetables and green salads. Eat 4-5 servings of vegetables per day. A serving of vegetables is: ? 1 cup of raw leafy vegetables. ?  cup of raw or cooked cut-up vegetables. ?  cup of vegetable juice.  Fill one fourth of your plate with whole grains. Look for the word "whole" as the first word in the ingredient list.  Fill one fourth of your plate with lean protein foods.  Eat 4-5 servings of fruit per day. A serving of fruit is: ? One medium whole fruit. ?  cup of dried fruit. ?  cup of fresh, frozen, or canned fruit. ?  cup of 100% fruit juice.  Eat more foods that contain soluble fiber. These include apples, broccoli, carrots, beans, peas, and barley. Try to get 20-30 g of fiber per day.  Eat more home-cooked food. Eat less restaurant, buffet, and fast food.  Limit or avoid alcohol.  Limit foods high in starch and sugar.  Avoid fried foods.  Avoid frying your food. Try baking, boiling, grilling, or broiling it instead. You can also reduce fat by: ? Removing the skin from poultry. ? Removing all visible fats from meats. ? Skimming the fat off of stews, soups, and gravies before serving them. ? Steaming vegetables in water or broth.  Lose weight if you are overweight.  Eat 4-5 servings of nuts, legumes, and seeds per week: ? One serving of dried beans or legumes equals  cup after being cooked. ? One serving of nuts equals 1 ounces. ? One serving of seeds equals  ounce or one tablespoon.  You may need to keep track of how much salt or sodium you eat. This is especially true if you have high blood pressure. Talk with your doctor or dietitian to get more  information. What foods can I eat? Grains Breads, including [place], white, pita, wheat, raisin, rye, oatmeal, and [place]. Tortillas that are neither fried nor made with lard or trans fat. Low-fat rolls, including hotdog and hamburger buns and English muffins. Biscuits. Muffins. Waffles. Pancakes. Light popcorn. Whole-grain cereals. Flatbread. Melba toast. Pretzels. Breadsticks. Rusks. Low-fat snacks. Low-fat crackers, including oyster, saltine, matzo, graham, animal, and rye. Rice and pasta, including brown rice and pastas that are made with whole wheat. Vegetables All vegetables. Fruits All fruits, but limit coconut. Meats and Other Protein Sources Lean, well-trimmed beef, veal, pork, and lamb. Chicken and [place] without skin. All fish and shellfish. Wild duck, rabbit, pheasant, and venison. Egg whites or low-cholesterol egg substitutes. Dried beans, peas, lentils, and tofu. Seeds and most nuts. Dairy Low-fat or nonfat cheeses, including ricotta, string, and mozzarella. Skim or 1% milk that is liquid, powdered, or evaporated. Buttermilk that is made with low-fat milk. Nonfat or low-fat yogurt. Beverages Mineral water. Diet carbonated beverages. Sweets and Desserts Sherbets and fruit ices. Honey, jam, marmalade, jelly, and syrups. Meringues and gelatins. Pure sugar candy, such as hard candy, jelly beans, gumdrops, mints, marshmallows, and small amounts of dark chocolate. [employer] Eat all sweets and desserts in moderation. Fats and Oils Nonhydrogenated (trans-free) margarines. Vegetable oils, including soybean, sesame, sunflower, olive, peanut, safflower, corn, canola, and cottonseed. Salad dressings or mayonnaise made with a vegetable oil. Limit added fats and oils that you use for cooking, baking, salads, and as spreads. Other Cocoa powder. Coffee and tea. All seasonings and condiments. The items listed above may not be a complete list of recommended foods or beverages. Contact your  dietitian for more options. What foods are not recommended? Grains Breads that are made with saturated or trans fats, oils, or whole milk. Croissants. Butter rolls. Cheese breads. Sweet rolls. Donuts. Buttered popcorn. Chow mein noodles. High-fat crackers, such as cheese or butter crackers. Meats and Other Protein Sources Fatty meats, such as hotdogs, short ribs, sausage, spareribs, bacon, rib eye roast or steak, and mutton. High-fat deli meats, such as salami and bologna. Caviar. Domestic duck and goose. Organ meats, such as kidney, liver, sweetbreads, and heart. Dairy Cream, sour cream, cream cheese, and creamed cottage cheese. Whole-milk cheeses, including blue (bleu), Monterey Jack, [place], [place], American, [place], Swiss, cheddar, [place], and [place]. Whole or 2% milk that is liquid, evaporated, or condensed. Whole buttermilk. Cream sauce or high-fat cheese sauce. Yogurt that is made from whole milk. Beverages Regular sodas and juice drinks with added sugar. Sweets and Desserts Frosting. Pudding. Cookies. Cakes other than angel food cake. Candy that has milk chocolate or white chocolate, hydrogenated fat, butter, coconut, or unknown ingredients. Buttered syrups. Full-fat ice cream or ice cream drinks. Fats and Oils Gravy that has suet, meat fat, or shortening. Cocoa butter, hydrogenated oils, palm oil, coconut oil, palm kernel oil. These can often be found in baked products, candy, fried foods, nondairy creamers, and whipped toppings. Solid fats and shortenings, including bacon fat, salt pork, lard, and butter. Nondairy cream substitutes, such as coffee creamers and sour cream substitutes. Salad dressings that are made of unknown oils, cheese, or sour cream. The items listed above may not be a complete list of foods and beverages to avoid. Contact your dietitian for more information. This information is not intended to replace advice given to you by your health care provider. Make sure you  discuss any questions you have with your health care provider. Document Released: 08/11/2011 Document Revised: 07/18/2015 Document  Reviewed: 08/03/2013 Elsevier Interactive Patient Education  2018 [employer].   Exercising to [employer] Exercising regularly is important. It has many health benefits, such as:  Improving your overall fitness, flexibility, and endurance.  Increasing your bone density.  Helping with weight control.  Decreasing your body fat.  Increasing your muscle strength.  Reducing stress and tension.  Improving your overall health.  In order to become healthy and stay healthy, it is recommended that you do moderate-intensity and vigorous-intensity exercise. You can tell that you are exercising at a moderate intensity if you have a higher heart rate and faster breathing, but you are still able to hold a conversation. You can tell that you are exercising at a vigorous intensity if you are breathing much harder and faster and cannot hold a conversation while exercising. How often should I exercise? Choose an activity that you enjoy and set realistic goals. Your health care provider can help you to make an activity plan that works for you. Exercise regularly as directed by your health care provider. This may include:  Doing resistance training twice each week, such as: ? Push-ups. ? Sit-ups. ? Lifting weights. ? Using resistance bands.  Doing a given intensity of exercise for a given amount of time. Choose from these options: ? 150 minutes of moderate-intensity exercise every week. ? 75 minutes of vigorous-intensity exercise every week. ? A mix of moderate-intensity and vigorous-intensity exercise every week.  Children, pregnant women, people who are out of shape, people who are overweight, and older adults may need to consult a health care provider for individual recommendations. If you have any sort of medical condition, be sure to consult your health care  provider before starting a new exercise program. What are some exercise ideas? Some moderate-intensity exercise ideas include:  Walking at a rate of 1 mile in 15 minutes.  Biking.  Hiking.  Golfing.  Dancing.  Some vigorous-intensity exercise ideas include:  Walking at a rate of at least 4.5 miles per hour.  Jogging or running at a rate of 5 miles per hour.  Biking at a rate of at least 10 miles per hour.  Lap swimming.  Roller-skating or in-line skating.  Cross-country skiing.  Vigorous competitive sports, such as football, basketball, and soccer.  Jumping rope.  Aerobic dancing.  What are some everyday activities that can help me to get exercise?  Garland work, such as: ? Pushing a Conservation officer, nature. ? Raking and bagging leaves.  Washing and waxing your car.  Pushing a stroller.  Shoveling snow.  Gardening.  Washing windows or floors. How can I be more active in my day-to-day activities?  Use the stairs instead of the elevator.  Take a walk during your lunch break.  If you drive, park your car farther away from work or school.  If you take public transportation, get off one stop early and walk the rest of the way.  Make all of your phone calls while standing up and walking around.  Get up, stretch, and walk around every 30 minutes throughout the day. What guidelines should I follow while exercising?  Do not exercise so much that you hurt yourself, feel dizzy, or get very short of breath.  Consult your health care provider before starting a new exercise program.  Wear comfortable clothes and shoes with good support.  Drink plenty of water while you exercise to prevent dehydration or heat stroke. Body water is lost during exercise and must be replaced.  Work out until  you breathe faster and your heart beats faster. This information is not intended to replace advice given to you by your health care provider. Make sure you discuss any questions you have  with your health care provider. Document Released: 03/14/2010 Document Revised: 07/18/2015 Document Reviewed: 07/13/2013 Elsevier Interactive Patient Education  [employer].   If you need a refill on your cardiac medications before your next appointment, please call your pharmacy.      Signed, Ermalinda Barrios, PA-C  08/17/2017 8:21 AM    [facility] [place], [place], [place]  [address] Phone: [phone]; Fax: [phone]

## 2017-08-17 ENCOUNTER — Encounter: Payer: Self-pay | Admitting: Physician Assistant

## 2017-08-17 ENCOUNTER — Ambulatory Visit: Payer: BLUE CROSS/BLUE SHIELD | Admitting: Physician Assistant

## 2017-08-17 VITALS — BP 130/70 | HR 72 | Ht 71.0 in | Wt 256.4 lb

## 2017-08-17 DIAGNOSIS — Z86711 Personal history of pulmonary embolism: Secondary | ICD-10-CM

## 2017-08-17 DIAGNOSIS — E782 Mixed hyperlipidemia: Secondary | ICD-10-CM | POA: Diagnosis not present

## 2017-08-17 DIAGNOSIS — R079 Chest pain, unspecified: Secondary | ICD-10-CM | POA: Diagnosis not present

## 2017-08-17 DIAGNOSIS — I10 Essential (primary) hypertension: Secondary | ICD-10-CM | POA: Diagnosis not present

## 2017-08-17 NOTE — Patient Instructions (Signed)
Medication Instructions:  Your physician recommends that you continue on your current medications as directed. Please refer to the Current Medication list given to you today.   Labwork: Keep FASTING lab appointment on 11/01/17  Testing/Procedures: None ordered  Follow-Up: Your physician wants you to follow-up AS NEEDED   Any Other Special Instructions Will Be Listed Below (If Applicable).  Be sure to get 20 minutes of exercise daily    Heart-Healthy Eating Plan Heart-healthy meal planning includes:  Limiting unhealthy fats.  Increasing healthy fats.  Making other small dietary changes.  You may need to talk with your doctor or a diet specialist (dietitian) to create an eating plan that is right for you. What types of fat should I choose?  Choose healthy fats. These include olive oil and canola oil, flaxseeds, walnuts, almonds, and seeds.  Eat more omega-3 fats. These include salmon, mackerel, sardines, tuna, flaxseed oil, and ground flaxseeds. Try to eat fish at least twice each week.  Limit saturated fats. ? Saturated fats are often found in animal products, such as meats, butter, and cream. ? Plant sources of saturated fats include palm oil, palm kernel oil, and coconut oil.  Avoid foods with partially hydrogenated oils in them. These include stick margarine, some tub margarines, cookies, crackers, and other baked goods. These contain trans fats. What general guidelines do I need to follow?  Check food labels carefully. Identify foods with trans fats or high amounts of saturated fat.  Fill one half of your plate with vegetables and green salads. Eat 4-5 servings of vegetables per day. A serving of vegetables is: ? 1 cup of raw leafy vegetables. ?  cup of raw or cooked cut-up vegetables. ?  cup of vegetable juice.  Fill one fourth of your plate with whole grains. Look for the word "whole" as the first word in the ingredient list.  Fill one fourth of your plate  with lean protein foods.  Eat 4-5 servings of fruit per day. A serving of fruit is: ? One medium whole fruit. ?  cup of dried fruit. ?  cup of fresh, frozen, or canned fruit. ?  cup of 100% fruit juice.  Eat more foods that contain soluble fiber. These include apples, broccoli, carrots, beans, peas, and barley. Try to get 20-30 g of fiber per day.  Eat more home-cooked food. Eat less restaurant, buffet, and fast food.  Limit or avoid alcohol.  Limit foods high in starch and sugar.  Avoid fried foods.  Avoid frying your food. Try baking, boiling, grilling, or broiling it instead. You can also reduce fat by: ? Removing the skin from poultry. ? Removing all visible fats from meats. ? Skimming the fat off of stews, soups, and gravies before serving them. ? Steaming vegetables in water or broth.  Lose weight if you are overweight.  Eat 4-5 servings of nuts, legumes, and seeds per week: ? One serving of dried beans or legumes equals  cup after being cooked. ? One serving of nuts equals 1 ounces. ? One serving of seeds equals  ounce or one tablespoon.  You may need to keep track of how much salt or sodium you eat. This is especially true if you have high blood pressure. Talk with your doctor or dietitian to get more information. What foods can I eat? Grains Breads, including [place], white, pita, wheat, raisin, rye, oatmeal, and [place]. Tortillas that are neither fried nor made with lard or trans fat. Low-fat rolls, including hotdog and hamburger  buns and English muffins. Biscuits. Muffins. Waffles. Pancakes. Light popcorn. Whole-grain cereals. Flatbread. Melba toast. Pretzels. Breadsticks. Rusks. Low-fat snacks. Low-fat crackers, including oyster, saltine, matzo, graham, animal, and rye. Rice and pasta, including brown rice and pastas that are made with whole wheat. Vegetables All vegetables. Fruits All fruits, but limit coconut. Meats and Other Protein Sources Lean,  well-trimmed beef, veal, pork, and lamb. Chicken and [place] without skin. All fish and shellfish. Wild duck, rabbit, pheasant, and venison. Egg whites or low-cholesterol egg substitutes. Dried beans, peas, lentils, and tofu. Seeds and most nuts. Dairy Low-fat or nonfat cheeses, including ricotta, string, and mozzarella. Skim or 1% milk that is liquid, powdered, or evaporated. Buttermilk that is made with low-fat milk. Nonfat or low-fat yogurt. Beverages Mineral water. Diet carbonated beverages. Sweets and Desserts Sherbets and fruit ices. Honey, jam, marmalade, jelly, and syrups. Meringues and gelatins. Pure sugar candy, such as hard candy, jelly beans, gumdrops, mints, marshmallows, and small amounts of dark chocolate. [employer] Eat all sweets and desserts in moderation. Fats and Oils Nonhydrogenated (trans-free) margarines. Vegetable oils, including soybean, sesame, sunflower, olive, peanut, safflower, corn, canola, and cottonseed. Salad dressings or mayonnaise made with a vegetable oil. Limit added fats and oils that you use for cooking, baking, salads, and as spreads. Other Cocoa powder. Coffee and tea. All seasonings and condiments. The items listed above may not be a complete list of recommended foods or beverages. Contact your dietitian for more options. What foods are not recommended? Grains Breads that are made with saturated or trans fats, oils, or whole milk. Croissants. Butter rolls. Cheese breads. Sweet rolls. Donuts. Buttered popcorn. Chow mein noodles. High-fat crackers, such as cheese or butter crackers. Meats and Other Protein Sources Fatty meats, such as hotdogs, short ribs, sausage, spareribs, bacon, rib eye roast or steak, and mutton. High-fat deli meats, such as salami and bologna. Caviar. Domestic duck and goose. Organ meats, such as kidney, liver, sweetbreads, and heart. Dairy Cream, sour cream, cream cheese, and creamed cottage cheese. Whole-milk cheeses, including  blue (bleu), Monterey Jack, [place], [place], American, [place], Swiss, cheddar, [place], and [place]. Whole or 2% milk that is liquid, evaporated, or condensed. Whole buttermilk. Cream sauce or high-fat cheese sauce. Yogurt that is made from whole milk. Beverages Regular sodas and juice drinks with added sugar. Sweets and Desserts Frosting. Pudding. Cookies. Cakes other than angel food cake. Candy that has milk chocolate or white chocolate, hydrogenated fat, butter, coconut, or unknown ingredients. Buttered syrups. Full-fat ice cream or ice cream drinks. Fats and Oils Gravy that has suet, meat fat, or shortening. Cocoa butter, hydrogenated oils, palm oil, coconut oil, palm kernel oil. These can often be found in baked products, candy, fried foods, nondairy creamers, and whipped toppings. Solid fats and shortenings, including bacon fat, salt pork, lard, and butter. Nondairy cream substitutes, such as coffee creamers and sour cream substitutes. Salad dressings that are made of unknown oils, cheese, or sour cream. The items listed above may not be a complete list of foods and beverages to avoid. Contact your dietitian for more information. This information is not intended to replace advice given to you by your health care provider. Make sure you discuss any questions you have with your health care provider. Document Released: 08/11/2011 Document Revised: 07/18/2015 Document Reviewed: 08/03/2013 Elsevier Interactive Patient Education  2018 [employer].   Exercising to [employer] Exercising regularly is important. It has many health benefits, such as:  Improving your overall fitness, flexibility, and endurance.  Increasing your bone density.  Helping with weight control.  Decreasing your body fat.  Increasing your muscle strength.  Reducing stress and tension.  Improving your overall health.  In order to become healthy and stay healthy, it is recommended that you do moderate-intensity and  vigorous-intensity exercise. You can tell that you are exercising at a moderate intensity if you have a higher heart rate and faster breathing, but you are still able to hold a conversation. You can tell that you are exercising at a vigorous intensity if you are breathing much harder and faster and cannot hold a conversation while exercising. How often should I exercise? Choose an activity that you enjoy and set realistic goals. Your health care provider can help you to make an activity plan that works for you. Exercise regularly as directed by your health care provider. This may include:  Doing resistance training twice each week, such as: ? Push-ups. ? Sit-ups. ? Lifting weights. ? Using resistance bands.  Doing a given intensity of exercise for a given amount of time. Choose from these options: ? 150 minutes of moderate-intensity exercise every week. ? 75 minutes of vigorous-intensity exercise every week. ? A mix of moderate-intensity and vigorous-intensity exercise every week.  Children, pregnant women, people who are out of shape, people who are overweight, and older adults may need to consult a health care provider for individual recommendations. If you have any sort of medical condition, be sure to consult your health care provider before starting a new exercise program. What are some exercise ideas? Some moderate-intensity exercise ideas include:  Walking at a rate of 1 mile in 15 minutes.  Biking.  Hiking.  Golfing.  Dancing.  Some vigorous-intensity exercise ideas include:  Walking at a rate of at least 4.5 miles per hour.  Jogging or running at a rate of 5 miles per hour.  Biking at a rate of at least 10 miles per hour.  Lap swimming.  Roller-skating or in-line skating.  Cross-country skiing.  Vigorous competitive sports, such as football, basketball, and soccer.  Jumping rope.  Aerobic dancing.  What are some everyday activities that can help me to get  exercise?  [place] work, such as: ? Pushing a Conservation officer, nature. ? Raking and bagging leaves.  Washing and waxing your car.  Pushing a stroller.  Shoveling snow.  Gardening.  Washing windows or floors. How can I be more active in my day-to-day activities?  Use the stairs instead of the elevator.  Take a walk during your lunch break.  If you drive, park your car farther away from work or school.  If you take public transportation, get off one stop early and walk the rest of the way.  Make all of your phone calls while standing up and walking around.  Get up, stretch, and walk around every 30 minutes throughout the day. What guidelines should I follow while exercising?  Do not exercise so much that you hurt yourself, feel dizzy, or get very short of breath.  Consult your health care provider before starting a new exercise program.  Wear comfortable clothes and shoes with good support.  Drink plenty of water while you exercise to prevent dehydration or heat stroke. Body water is lost during exercise and must be replaced.  Work out until you breathe faster and your heart beats faster. This information is not intended to replace advice given to you by your health care provider. Make sure you discuss any questions you have with your health  care provider. Document Released: 03/14/2010 Document Revised: 07/18/2015 Document Reviewed: 07/13/2013 Elsevier Interactive Patient Education  [employer].   If you need a refill on your cardiac medications before your next appointment, please call your pharmacy.

## 2017-08-17 NOTE — Telephone Encounter (Signed)
**Note De-Identified Williom [facility] Obfuscation** Following message received through covermymeds:  [place]: [place] - [place] Case ID: [id number] - Rx #: [id number] Outcome  Approved on June 7  CaseId:[id number];Status:Approved;Review Type:Prior Auth;Coverage Start Date:06/30/2017;Coverage End Date:07/29/2020;

## 2017-09-14 ENCOUNTER — Other Ambulatory Visit: Payer: Self-pay

## 2017-09-14 ENCOUNTER — Encounter: Payer: Self-pay | Admitting: Physician Assistant

## 2017-09-14 ENCOUNTER — Ambulatory Visit (INDEPENDENT_AMBULATORY_CARE_PROVIDER_SITE_OTHER): Payer: BLUE CROSS/BLUE SHIELD | Admitting: Physician Assistant

## 2017-09-14 VITALS — BP 158/85 | HR 78 | Temp 97.7°F | Ht 71.0 in | Wt 256.0 lb

## 2017-09-14 DIAGNOSIS — Z23 Encounter for immunization: Secondary | ICD-10-CM | POA: Diagnosis not present

## 2017-09-14 DIAGNOSIS — S61011A Laceration without foreign body of right thumb without damage to nail, initial encounter: Secondary | ICD-10-CM

## 2017-09-14 NOTE — Progress Notes (Signed)
   Gerald Morales  MRN: [id number] DOB: 03-Aug-1950  PCP: Deland Pretty, MD  Subjective:  Pt is a pleasant 67 year old male who presents to clinic for thumb laceration.  He was trying to pull off and emblem on his car when he sliced the side of his right thumb.  He rinse it off with water however did not wash it.  There is no involvement of his nail.  Denies decreased range of motion.  He is not current on his tetanus shot.  Review of Systems  Musculoskeletal: Negative for arthralgias and joint swelling.  Skin: Positive for wound.  Neurological: Negative for dizziness, weakness and numbness.    Patient Active Problem List   Diagnosis Date Noted  . Chest pain 06/15/2017  . Hx of bladder cancer 06/02/2014  . Hx pulmonary embolism 06/02/2014  . Diverticulitis of colon 06/02/2014  . Erectile dysfunction 06/02/2014  . Essential hypertension 06/02/2014  . Hyperlipidemia 06/02/2014    Current Outpatient Medications on File Prior to Visit  Medication Sig Dispense Refill  . Icosapent Ethyl (VASCEPA) 1 g CAPS Take 2 capsules (2 g total) by mouth 2 (two) times daily. 120 capsule 6  . losartan-hydrochlorothiazide (HYZAAR) 100-25 MG per tablet Take 1 tablet by mouth daily.     . Multiple Vitamin (MULTIVITAMIN) capsule Take 1 capsule by mouth daily.    . rivaroxaban (XARELTO) 20 MG TABS tablet Take 20 mg by mouth daily.     . rosuvastatin (CRESTOR) 20 MG tablet Take 10 mg by mouth daily.      No current facility-administered medications on file prior to visit.     Allergies  Allergen Reactions  . Lipitor [Atorvastatin] Anxiety and Other (See Comments)    Patient stated,"anxiety, increased heart rate, nervousness."  . Vytorin [Ezetimibe-Simvastatin] Anxiety and Other (See Comments)    Patient stated,"anxiety, increased heart rate, nervousness."     Objective:  BP (!) 158/85 (BP Location: Left Arm, Patient Position: Sitting, Cuff Size: Normal)   Pulse 78   Temp 97.7 F (36.5 C)  (Oral)   Ht 5\' 11"  (1.803 m)   Wt 256 lb (116.1 kg)   SpO2 92%   BMI 35.70 kg/m   Physical Exam  Constitutional: He is oriented to person, place, and time. He appears well-developed and well-nourished.  Neurological: He is alert and oriented to person, place, and time.  Skin: Skin is warm and dry. Laceration noted.     Psychiatric: He has a normal mood and affect. His behavior is normal. Judgment and thought content normal.  Vitals reviewed.  Procedure: Verbal consent obtained. Skin was anesthetized with 2% lidocaine and cleaned with soap and water. Wound was explored and no deep structures involved. Laceration was sutured with 4 simple interrupted sutures. Wound was dressed and wound care discussed.  Assessment and Plan :  1. Laceration of right thumb without foreign body without damage to nail, initial encounter -Patient presents with laceration of his right thumb which was repaired with 4 sutures.  Bulky dressing applied.  Wound care discussed.  Return to clinic in 12 days for suture removal.  Tdap administered by CMA.  2. Need for Tdap vaccination - Tdap vaccine greater than or equal to 7yo IM   Mercer Pod, PA-C  [facility] [place] 09/14/2017 6:02 PM

## 2017-09-14 NOTE — Patient Instructions (Addendum)
Please try to prevent injury to the tip of your thumb while it is healing.   WOUND CARE Please return in 12 days to have your stitches/staples removed or sooner if you have concerns. Marland Kitchen Keep area clean and dry with dressing in place for 24 hours. . After 24 hours, remove bandage and wash wound gently with mild soap and warm water. When skin is dry, reapply a new bandage. Once wound is no longer draining, may leave wound open to air. . Continue daily cleansing with soap and water until stitches/staples are removed. . Do not apply any ointments or creams to the wound while stitches/staples are in place, as this may cause delayed healing. . Notify the office if you experience any of the following signs of infection: Swelling, redness, pus drainage, streaking, fever >101.0 F . Notify the office if you experience excessive bleeding that does not stop after 15-20 minutes of constant, firm pressure.    IF you received an x-ray today, you will receive an invoice from [facility]. Please contact [facility] at [phone] with questions or concerns regarding your invoice.   IF you received labwork today, you will receive an invoice from [facility]. Please contact [facility] at [phone] with questions or concerns regarding your invoice.   Our billing staff will not be able to assist you with questions regarding bills from these companies.  You will be contacted with the lab results as soon as they are available. The fastest way to get your results is to activate your My Chart account. Instructions are located on the last page of this paperwork. If you have not heard from [place] regarding the results in 2 weeks, please contact this office.

## 2017-09-27 ENCOUNTER — Ambulatory Visit: Payer: BLUE CROSS/BLUE SHIELD | Admitting: Physician Assistant

## 2017-09-27 ENCOUNTER — Other Ambulatory Visit: Payer: Self-pay

## 2017-09-27 ENCOUNTER — Encounter: Payer: Self-pay | Admitting: Physician Assistant

## 2017-09-27 VITALS — BP 149/83 | HR 69 | Temp 97.6°F | Resp 18 | Ht 71.0 in | Wt 259.0 lb

## 2017-09-27 DIAGNOSIS — S61011D Laceration without foreign body of right thumb without damage to nail, subsequent encounter: Secondary | ICD-10-CM

## 2017-09-27 DIAGNOSIS — Z4802 Encounter for removal of sutures: Secondary | ICD-10-CM

## 2017-09-27 NOTE — Patient Instructions (Signed)
     IF you received an x-ray today, you will receive an invoice from [facility]. Please contact [facility] at [phone] with questions or concerns regarding your invoice.   IF you received labwork today, you will receive an invoice from [facility]. Please contact [facility] at [phone] with questions or concerns regarding your invoice.   Our billing staff will not be able to assist you with questions regarding bills from these companies.  You will be contacted with the lab results as soon as they are available. The fastest way to get your results is to activate your My Chart account. Instructions are located on the last page of this paperwork. If you have not heard from us regarding the results in 2 weeks, please contact this office.     

## 2017-09-27 NOTE — Progress Notes (Signed)
   SUNDIATA FERRICK  MRN: [id number] DOB: February 07, 1951  PCP: Deland Pretty, MD  Subjective:  Pt is a 67 year old male who presents to clinic for follow-up laceration.  He was here on 7/23 and received sutures to his right thumb.  He has been doing well and keeping it protected with a splint he got at his pharmacy. Denies fever, chills, joint pain, redness, increased warmth  Review of Systems  Constitutional: Negative for chills and fever.  Musculoskeletal: Negative for arthralgias and joint swelling.  Skin: Positive for wound.    Patient Active Problem List   Diagnosis Date Noted  . Chest pain 06/15/2017  . Hx of bladder cancer 06/02/2014  . Hx pulmonary embolism 06/02/2014  . Diverticulitis of colon 06/02/2014  . Erectile dysfunction 06/02/2014  . Essential hypertension 06/02/2014  . Hyperlipidemia 06/02/2014    Current Outpatient Medications on File Prior to Visit  Medication Sig Dispense Refill  . Icosapent Ethyl (VASCEPA) 1 g CAPS Take 2 capsules (2 g total) by mouth 2 (two) times daily. 120 capsule 6  . losartan-hydrochlorothiazide (HYZAAR) 100-25 MG per tablet Take 1 tablet by mouth daily.     . Multiple Vitamin (MULTIVITAMIN) capsule Take 1 capsule by mouth daily.    . rivaroxaban (XARELTO) 20 MG TABS tablet Take 20 mg by mouth daily.     . rosuvastatin (CRESTOR) 20 MG tablet Take 10 mg by mouth daily.      No current facility-administered medications on file prior to visit.     Allergies  Allergen Reactions  . Lipitor [Atorvastatin] Anxiety and Other (See Comments)    Patient stated,"anxiety, increased heart rate, nervousness."  . Vytorin [Ezetimibe-Simvastatin] Anxiety and Other (See Comments)    Patient stated,"anxiety, increased heart rate, nervousness."     Objective:  BP (!) 149/83   Pulse 69   Temp 97.6 F (36.4 C) (Oral)   Resp 18   Ht 5\' 11"  (1.803 m)   Wt 259 lb (117.5 kg)   SpO2 94%   BMI 36.12 kg/m   Physical Exam  Constitutional: He is  oriented to person, place, and time. He appears well-developed and well-nourished.  Neurological: He is alert and oriented to person, place, and time.  Skin: Skin is warm and dry.     Psychiatric: He has a normal mood and affect. His behavior is normal. Judgment and thought content normal.  Vitals reviewed.   Assessment and Plan :  1. Visit for suture removal 2. Laceration of right thumb without foreign body without damage to nail, subsequent encounter -Patient presents for suture removal following a laceration repair about 2 weeks ago.  He is feeling well.  No sign of infection.  Wound is healing appropriately and sutures were removed without difficulty.  Advised to continue protection.  Wound care discussed.  Return to clinic as needed.  Mercer Pod, PA-C  [facility] [place] 09/27/2017 6:38 PM  Please note: Portions of this report may have been transcribed using dragon voice recognition software. Every effort was made to ensure accuracy; however, inadvertent computerized transcription errors may be present.

## 2017-09-29 ENCOUNTER — Ambulatory Visit: Payer: BLUE CROSS/BLUE SHIELD | Admitting: Physician Assistant

## 2017-11-01 ENCOUNTER — Other Ambulatory Visit: Payer: BLUE CROSS/BLUE SHIELD

## 2017-11-03 ENCOUNTER — Other Ambulatory Visit: Payer: BLUE CROSS/BLUE SHIELD | Admitting: *Deleted

## 2017-11-03 DIAGNOSIS — Z79899 Other long term (current) drug therapy: Secondary | ICD-10-CM | POA: Diagnosis not present

## 2017-11-03 LAB — LIPID PANEL
CHOL/HDL RATIO: 4.3 ratio (ref 0.0–5.0)
Cholesterol, Total: 147 mg/dL (ref 100–199)
HDL: 34 mg/dL — ABNORMAL LOW (ref >39)
LDL Calculated: 83 mg/dL (ref 0–99)
Triglycerides: 149 mg/dL (ref 0–149)
VLDL Cholesterol Cal: 30 mg/dL (ref 5–40)

## 2017-11-04 NOTE — Progress Notes (Signed)
Pt has been made aware of normal result and verbalized understanding.  jw 11/04/17

## 2017-12-17 DIAGNOSIS — Z23 Encounter for immunization: Secondary | ICD-10-CM | POA: Diagnosis not present

## 2018-01-12 DIAGNOSIS — Z125 Encounter for screening for malignant neoplasm of prostate: Secondary | ICD-10-CM | POA: Diagnosis not present

## 2018-01-12 DIAGNOSIS — Z0000 Encounter for general adult medical examination without abnormal findings: Secondary | ICD-10-CM | POA: Diagnosis not present

## 2018-01-17 DIAGNOSIS — R760 Raised antibody titer: Secondary | ICD-10-CM | POA: Diagnosis not present

## 2018-01-17 DIAGNOSIS — Z23 Encounter for immunization: Secondary | ICD-10-CM | POA: Diagnosis not present

## 2018-01-17 DIAGNOSIS — Z0001 Encounter for general adult medical examination with abnormal findings: Secondary | ICD-10-CM | POA: Diagnosis not present

## 2018-01-17 DIAGNOSIS — I10 Essential (primary) hypertension: Secondary | ICD-10-CM | POA: Diagnosis not present

## 2018-01-17 DIAGNOSIS — I2510 Atherosclerotic heart disease of native coronary artery without angina pectoris: Secondary | ICD-10-CM | POA: Diagnosis not present

## 2018-03-04 DIAGNOSIS — L918 Other hypertrophic disorders of the skin: Secondary | ICD-10-CM | POA: Diagnosis not present

## 2018-03-04 DIAGNOSIS — X32XXXD Exposure to sunlight, subsequent encounter: Secondary | ICD-10-CM | POA: Diagnosis not present

## 2018-03-04 DIAGNOSIS — B078 Other viral warts: Secondary | ICD-10-CM | POA: Diagnosis not present

## 2018-03-04 DIAGNOSIS — D485 Neoplasm of uncertain behavior of skin: Secondary | ICD-10-CM | POA: Diagnosis not present

## 2018-03-04 DIAGNOSIS — D225 Melanocytic nevi of trunk: Secondary | ICD-10-CM | POA: Diagnosis not present

## 2018-03-04 DIAGNOSIS — L570 Actinic keratosis: Secondary | ICD-10-CM | POA: Diagnosis not present

## 2018-03-04 DIAGNOSIS — Z1283 Encounter for screening for malignant neoplasm of skin: Secondary | ICD-10-CM | POA: Diagnosis not present

## 2018-04-01 ENCOUNTER — Encounter (INDEPENDENT_AMBULATORY_CARE_PROVIDER_SITE_OTHER): Payer: Self-pay | Admitting: Orthopaedic Surgery

## 2018-04-01 ENCOUNTER — Ambulatory Visit (INDEPENDENT_AMBULATORY_CARE_PROVIDER_SITE_OTHER): Payer: BLUE CROSS/BLUE SHIELD | Admitting: Orthopaedic Surgery

## 2018-04-01 ENCOUNTER — Ambulatory Visit (INDEPENDENT_AMBULATORY_CARE_PROVIDER_SITE_OTHER): Payer: Self-pay

## 2018-04-01 VITALS — Ht 71.0 in | Wt 259.0 lb

## 2018-04-01 DIAGNOSIS — M1712 Unilateral primary osteoarthritis, left knee: Secondary | ICD-10-CM | POA: Insufficient documentation

## 2018-04-01 MED ORDER — METHYLPREDNISOLONE ACETATE 40 MG/ML IJ SUSP
40.0000 mg | INTRAMUSCULAR | Status: AC | PRN
  Administered 2018-04-01: 40 mg via INTRA_ARTICULAR

## 2018-04-01 MED ORDER — BUPIVACAINE HCL 0.25 % IJ SOLN
2.0000 mL | INTRAMUSCULAR | Status: AC | PRN
  Administered 2018-04-01: 2 mL via INTRA_ARTICULAR

## 2018-04-01 MED ORDER — LIDOCAINE HCL 1 % IJ SOLN
2.0000 mL | INTRAMUSCULAR | Status: AC | PRN
  Administered 2018-04-01: 2 mL

## 2018-04-01 NOTE — Progress Notes (Signed)
Office Visit Note   Patient: Gerald Morales           Date of Birth: 11-Jul-1950           MRN: [id number] Visit Date: 04/01/2018              Requested by: Deland Pretty, MD [address] [place], [place] [address] PCP: Deland Pretty, MD   Assessment & Plan: Visit Diagnoses:  1. Unilateral primary osteoarthritis, left knee     Plan: Impression is primary localized osteoarthritis left knee.  We aspirated 30 cc blood tinged fluid and then injected the left knee with cortisone.  We also discussed viscosupplementation injection should he fail to get relief with cortisone.  I also discussed with him definitive treatment of total knee replacement at some point in the future.  Follow-up with [place] as needed.  Follow-Up Instructions: Return if symptoms worsen or fail to improve.   Orders:  Orders Placed This Encounter  Procedures  . Large Joint Inj: L knee  . XR KNEE 3 VIEW LEFT   No orders of the defined types were placed in this encounter.     Procedures: Large Joint Inj: L knee on 04/01/2018 9:56 AM Indications: pain Details: 22 G needle, anterolateral approach Medications: 2 mL lidocaine 1 %; 2 mL bupivacaine 0.25 %; 40 mg methylPREDNISolone acetate 40 MG/ML      Clinical Data: No additional findings.   Subjective: Chief Complaint  Patient presents with  . Left Knee - Pain    HPI patient is a pleasant 68 year old gentleman who presents our clinic today with left knee pain.  This has been ongoing for the past several years.  He has noted increased pain following the meniscal repair approximately 7 to 10 years ago.  Pain has recently worsened over the past few weeks.  The knee has is described as a constant toothache with associated swelling.  He does have occasional night pain.  Is been taking Aleve with mild relief of symptoms.    Review of Systems as detailed in HPI.  All others reviewed and are negative.   Objective: Vital Signs: Ht 5\' 11"  (1.803 m)    Wt 259 lb (117.5 kg)   BMI 36.12 kg/m   Physical Exam well-developed well-nourished gentleman no acute distress.  Alert and oriented x3.  Ortho Exam examination of left knee shows a small effusion.  Range of motion 0 to 95 degrees.  No joint line tenderness.  Moderate patellofemoral crepitus.  He is neurovascular intact distally.  Specialty Comments:  No specialty comments available.  Imaging: Xr Knee 3 View Left  Result Date: 04/01/2018 X-rays demonstrate marked tricompartmental degenerative changes    PMFS History: Patient Active Problem List   Diagnosis Date Noted  . Unilateral primary osteoarthritis, left knee 04/01/2018  . Chest pain 06/15/2017  . Hx of bladder cancer 06/02/2014  . Hx pulmonary embolism 06/02/2014  . Diverticulitis of colon 06/02/2014  . Erectile dysfunction 06/02/2014  . Essential hypertension 06/02/2014  . Hyperlipidemia 06/02/2014   Past Medical History:  Diagnosis Date  . Cancer ([place])    bladder  . Hyperlipidemia   . Hypertension   . PE (pulmonary thromboembolism) ([facility])     Family History  Problem Relation Age of Onset  . Hypertension Mother   . Diabetes Mother   . Hypertension Father     Past Surgical History:  Procedure Laterality Date  . FRACTURE SURGERY    . HERNIA REPAIR  Social History   Occupational History  . Not on file  Tobacco Use  . Smoking status: Former Research scientist (life sciences)  . Smokeless tobacco: Former Network engineer and Sexual Activity  . Alcohol use: Yes    Alcohol/week: 0.0 standard drinks    Comment: 1-2 beers nightly  . Drug use: No  . Sexual activity: Never

## 2018-05-04 DIAGNOSIS — R7303 Prediabetes: Secondary | ICD-10-CM | POA: Diagnosis not present

## 2018-05-04 DIAGNOSIS — I10 Essential (primary) hypertension: Secondary | ICD-10-CM | POA: Diagnosis not present

## 2018-08-02 DIAGNOSIS — L03115 Cellulitis of right lower limb: Secondary | ICD-10-CM | POA: Diagnosis not present

## 2018-08-05 DIAGNOSIS — L03115 Cellulitis of right lower limb: Secondary | ICD-10-CM | POA: Diagnosis not present

## 2018-08-05 DIAGNOSIS — S70361A Insect bite (nonvenomous), right thigh, initial encounter: Secondary | ICD-10-CM | POA: Diagnosis not present

## 2018-08-05 DIAGNOSIS — W57XXXD Bitten or stung by nonvenomous insect and other nonvenomous arthropods, subsequent encounter: Secondary | ICD-10-CM | POA: Diagnosis not present

## 2018-10-28 DIAGNOSIS — Z23 Encounter for immunization: Secondary | ICD-10-CM | POA: Diagnosis not present

## 2018-11-30 DIAGNOSIS — Z1159 Encounter for screening for other viral diseases: Secondary | ICD-10-CM | POA: Diagnosis not present

## 2018-12-05 DIAGNOSIS — Z86010 Personal history of colonic polyps: Secondary | ICD-10-CM | POA: Diagnosis not present

## 2018-12-05 DIAGNOSIS — K635 Polyp of colon: Secondary | ICD-10-CM | POA: Diagnosis not present

## 2018-12-05 DIAGNOSIS — K5730 Diverticulosis of large intestine without perforation or abscess without bleeding: Secondary | ICD-10-CM | POA: Diagnosis not present

## 2019-01-12 DIAGNOSIS — Z0000 Encounter for general adult medical examination without abnormal findings: Secondary | ICD-10-CM | POA: Diagnosis not present

## 2019-01-12 DIAGNOSIS — Z125 Encounter for screening for malignant neoplasm of prostate: Secondary | ICD-10-CM | POA: Diagnosis not present

## 2019-01-12 DIAGNOSIS — E782 Mixed hyperlipidemia: Secondary | ICD-10-CM | POA: Diagnosis not present

## 2019-01-17 DIAGNOSIS — Z0001 Encounter for general adult medical examination with abnormal findings: Secondary | ICD-10-CM | POA: Diagnosis not present

## 2019-01-17 DIAGNOSIS — I2510 Atherosclerotic heart disease of native coronary artery without angina pectoris: Secondary | ICD-10-CM | POA: Diagnosis not present

## 2019-01-17 DIAGNOSIS — D6859 Other primary thrombophilia: Secondary | ICD-10-CM | POA: Diagnosis not present

## 2019-01-17 DIAGNOSIS — Z1212 Encounter for screening for malignant neoplasm of rectum: Secondary | ICD-10-CM | POA: Diagnosis not present

## 2019-01-17 DIAGNOSIS — R7303 Prediabetes: Secondary | ICD-10-CM | POA: Diagnosis not present

## 2019-01-17 DIAGNOSIS — I10 Essential (primary) hypertension: Secondary | ICD-10-CM | POA: Diagnosis not present

## 2019-03-26 ENCOUNTER — Ambulatory Visit: Payer: BLUE CROSS/BLUE SHIELD

## 2019-03-31 ENCOUNTER — Ambulatory Visit: Payer: BLUE CROSS/BLUE SHIELD

## 2019-04-02 ENCOUNTER — Ambulatory Visit: Payer: BC Managed Care – PPO | Attending: Internal Medicine

## 2019-04-06 DIAGNOSIS — Z6838 Body mass index (BMI) 38.0-38.9, adult: Secondary | ICD-10-CM | POA: Diagnosis not present

## 2019-04-06 DIAGNOSIS — R7303 Prediabetes: Secondary | ICD-10-CM | POA: Diagnosis not present

## 2019-04-24 DIAGNOSIS — R3915 Urgency of urination: Secondary | ICD-10-CM | POA: Diagnosis not present

## 2019-04-24 DIAGNOSIS — C678 Malignant neoplasm of overlapping sites of bladder: Secondary | ICD-10-CM | POA: Diagnosis not present

## 2019-04-26 ENCOUNTER — Other Ambulatory Visit: Payer: Self-pay | Admitting: Urology

## 2019-04-27 ENCOUNTER — Other Ambulatory Visit: Payer: Self-pay | Admitting: Urology

## 2019-05-03 DIAGNOSIS — Z86711 Personal history of pulmonary embolism: Secondary | ICD-10-CM | POA: Diagnosis not present

## 2019-05-03 DIAGNOSIS — Z01818 Encounter for other preprocedural examination: Secondary | ICD-10-CM | POA: Diagnosis not present

## 2019-05-03 DIAGNOSIS — D494 Neoplasm of unspecified behavior of bladder: Secondary | ICD-10-CM | POA: Diagnosis not present

## 2019-05-08 ENCOUNTER — Encounter (HOSPITAL_BASED_OUTPATIENT_CLINIC_OR_DEPARTMENT_OTHER): Payer: Self-pay | Admitting: Urology

## 2019-05-08 ENCOUNTER — Other Ambulatory Visit: Payer: Self-pay

## 2019-05-08 NOTE — Progress Notes (Addendum)
ADDENDUM:  Received pt's pcp clearance via fax from dr Tresa Moore office, placed in chart.  ADDENDUM:  Chart reviewed by anesthesia, Konrad Felix PA, ok to proceed.    Spoke w/ via phone for pre-op interview--- PT Lab needs dos----  Istat 8 and EKG             Lab results------ no COVID test ------ 05-09-2019 @ 1455 Arrive at ------- 1100 NPO after ------  MN w/ exception clear liquids until 0700 then nothing by mouth (no cream /milk products) Medications to take morning of surgery ----- NONE Diabetic medication ----- n/a Patient Special Instructions ----- reviewed [facility] guidelines and visitor restriction policy.  Asked to bring his home medications with him dos Pre-Op special Istructions ----- n/a Patient verbalized understanding of instructions that were given at this phone interview. Patient denies shortness of breath, chest pain, fever, cough a this phone interview.   Anesthesia Review:  Hx HTN, PE 07/ 2015, hx positive lupus anticoagulant, multinodular guitor.  Updated epic hx with information. Chart to be reviewed by anesthesia.  PCP:  Dr Viona Gilmore. Shelia Media Cardiologist :  Pt has has 2 episodes atypical cp and evaluation by cardiology in epic (2008 and 2019) Chest x-ray :  04-26-2017 epic EKG :  04-26-2017 epic Echo : no Stress test:  nulcear 10-07-2007 epic CTA:  08-03-2017 epic Cardiac Cath :  no Sleep Study/ CPAP : NO Fasting Blood Sugar :      / Checks Blood Sugar -- times a day:   N/A Blood Thinner/ Instructions /Last Dose:  Xarelto/ per pt was given instructions from , pcp Dr Shelia Media to stop 48 hrs prior to surgery/ have not received the clearance from dr Tresa Moore office, called and requested ASA / Instructions:   NO

## 2019-05-09 ENCOUNTER — Other Ambulatory Visit (HOSPITAL_COMMUNITY)
Admission: RE | Admit: 2019-05-09 | Discharge: 2019-05-09 | Disposition: A | Discharge status: Home / Self Care | Payer: BC Managed Care – PPO | Source: Ambulatory Visit | Attending: Urology | Admitting: Urology

## 2019-05-09 DIAGNOSIS — Z01812 Encounter for preprocedural laboratory examination: Secondary | ICD-10-CM | POA: Diagnosis not present

## 2019-05-09 DIAGNOSIS — Z20822 Contact with and (suspected) exposure to covid-19: Secondary | ICD-10-CM | POA: Diagnosis not present

## 2019-05-09 LAB — SARS CORONAVIRUS 2 (TAT 6-24 HRS): SARS Coronavirus 2: NEGATIVE

## 2019-05-12 ENCOUNTER — Encounter (HOSPITAL_BASED_OUTPATIENT_CLINIC_OR_DEPARTMENT_OTHER): Payer: Self-pay | Admitting: Urology

## 2019-05-12 ENCOUNTER — Ambulatory Visit (HOSPITAL_BASED_OUTPATIENT_CLINIC_OR_DEPARTMENT_OTHER): Payer: BC Managed Care – PPO | Admitting: Physician Assistant

## 2019-05-12 ENCOUNTER — Encounter (HOSPITAL_BASED_OUTPATIENT_CLINIC_OR_DEPARTMENT_OTHER)
Admission: RE | Disposition: A | Discharge status: Home / Self Care | Payer: Self-pay | Source: Home / Self Care | Attending: Urology

## 2019-05-12 ENCOUNTER — Other Ambulatory Visit: Payer: Self-pay

## 2019-05-12 ENCOUNTER — Ambulatory Visit (HOSPITAL_BASED_OUTPATIENT_CLINIC_OR_DEPARTMENT_OTHER)
Admission: RE | Admit: 2019-05-12 | Discharge: 2019-05-13 | Disposition: A | Discharge status: Home / Self Care | Payer: BC Managed Care – PPO | Attending: Urology | Admitting: Urology

## 2019-05-12 DIAGNOSIS — R351 Nocturia: Secondary | ICD-10-CM | POA: Insufficient documentation

## 2019-05-12 DIAGNOSIS — Z86711 Personal history of pulmonary embolism: Secondary | ICD-10-CM | POA: Diagnosis not present

## 2019-05-12 DIAGNOSIS — M1712 Unilateral primary osteoarthritis, left knee: Secondary | ICD-10-CM | POA: Diagnosis not present

## 2019-05-12 DIAGNOSIS — C679 Malignant neoplasm of bladder, unspecified: Secondary | ICD-10-CM | POA: Diagnosis not present

## 2019-05-12 DIAGNOSIS — I10 Essential (primary) hypertension: Secondary | ICD-10-CM | POA: Diagnosis not present

## 2019-05-12 DIAGNOSIS — D494 Neoplasm of unspecified behavior of bladder: Secondary | ICD-10-CM | POA: Diagnosis not present

## 2019-05-12 DIAGNOSIS — D6862 Lupus anticoagulant syndrome: Secondary | ICD-10-CM | POA: Insufficient documentation

## 2019-05-12 DIAGNOSIS — Z8551 Personal history of malignant neoplasm of bladder: Secondary | ICD-10-CM | POA: Insufficient documentation

## 2019-05-12 DIAGNOSIS — Z7901 Long term (current) use of anticoagulants: Secondary | ICD-10-CM | POA: Insufficient documentation

## 2019-05-12 DIAGNOSIS — Z89021 Acquired absence of right finger(s): Secondary | ICD-10-CM | POA: Insufficient documentation

## 2019-05-12 DIAGNOSIS — Z86718 Personal history of other venous thrombosis and embolism: Secondary | ICD-10-CM | POA: Insufficient documentation

## 2019-05-12 DIAGNOSIS — N138 Other obstructive and reflux uropathy: Secondary | ICD-10-CM | POA: Diagnosis not present

## 2019-05-12 DIAGNOSIS — Z87891 Personal history of nicotine dependence: Secondary | ICD-10-CM | POA: Diagnosis not present

## 2019-05-12 DIAGNOSIS — E785 Hyperlipidemia, unspecified: Secondary | ICD-10-CM | POA: Diagnosis not present

## 2019-05-12 DIAGNOSIS — N400 Enlarged prostate without lower urinary tract symptoms: Secondary | ICD-10-CM | POA: Diagnosis not present

## 2019-05-12 DIAGNOSIS — C675 Malignant neoplasm of bladder neck: Secondary | ICD-10-CM | POA: Diagnosis not present

## 2019-05-12 DIAGNOSIS — N401 Enlarged prostate with lower urinary tract symptoms: Secondary | ICD-10-CM | POA: Diagnosis not present

## 2019-05-12 HISTORY — DX: Nontoxic multinodular goiter: E04.2

## 2019-05-12 HISTORY — DX: Long term (current) use of anticoagulants: Z79.01

## 2019-05-12 HISTORY — PX: TRANSURETHRAL RESECTION OF PROSTATE: SHX73

## 2019-05-12 HISTORY — DX: Raised antibody titer: R76.0

## 2019-05-12 HISTORY — DX: Personal history of other specified conditions: Z87.898

## 2019-05-12 HISTORY — DX: Unspecified osteoarthritis, unspecified site: M19.90

## 2019-05-12 HISTORY — PX: TRANSURETHRAL RESECTION OF BLADDER TUMOR: SHX2575

## 2019-05-12 HISTORY — DX: Unspecified symptoms and signs involving the genitourinary system: R39.9

## 2019-05-12 HISTORY — DX: Personal history of pulmonary embolism: Z86.711

## 2019-05-12 HISTORY — DX: Malignant neoplasm of bladder, unspecified: C67.9

## 2019-05-12 HISTORY — DX: Mixed hyperlipidemia: E78.2

## 2019-05-12 LAB — POCT I-STAT, CHEM 8
BUN: 20 mg/dL (ref 8–23)
Calcium, Ion: 1.24 mmol/L (ref 1.15–1.40)
Chloride: 102 mmol/L (ref 98–111)
Creatinine, Ser: 1.2 mg/dL (ref 0.61–1.24)
Glucose, Bld: 112 mg/dL — ABNORMAL HIGH (ref 70–99)
HCT: 48 % (ref 39.0–52.0)
Hemoglobin: 16.3 g/dL (ref 13.0–17.0)
Potassium: 3.5 mmol/L (ref 3.5–5.1)
Sodium: 140 mmol/L (ref 135–145)
TCO2: 27 mmol/L (ref 22–32)

## 2019-05-12 SURGERY — TURP (TRANSURETHRAL RESECTION OF PROSTATE)
Anesthesia: General | Site: Prostate

## 2019-05-12 MED ORDER — OXYCODONE-ACETAMINOPHEN 5-325 MG PO TABS: 1.0000 | ORAL_TABLET | Freq: Four times a day (QID) | ORAL | 0 refills | Status: DC | PRN

## 2019-05-12 MED ORDER — SENNA 8.6 MG PO TABS
ORAL_TABLET | ORAL | Status: AC
  Filled 2019-05-12: qty 1

## 2019-05-12 MED ORDER — ACETAMINOPHEN 500 MG PO TABS
1000.0000 mg | ORAL_TABLET | Freq: Four times a day (QID) | ORAL | Status: DC
  Administered 2019-05-12 – 2019-05-13 (×3): 1000 mg via ORAL
  Filled 2019-05-12: qty 2

## 2019-05-12 MED ORDER — SENNOSIDES-DOCUSATE SODIUM 8.6-50 MG PO TABS: 1.0000 | ORAL_TABLET | Freq: Two times a day (BID) | ORAL | 0 refills | Status: DC

## 2019-05-12 MED ORDER — SULFAMETHOXAZOLE-TRIMETHOPRIM 800-160 MG PO TABS: 1.0000 | ORAL_TABLET | Freq: Two times a day (BID) | ORAL | 0 refills | Status: DC

## 2019-05-12 MED ORDER — ACETAMINOPHEN 500 MG PO TABS
1000.0000 mg | ORAL_TABLET | Freq: Once | ORAL | Status: AC
  Administered 2019-05-12: 1000 mg via ORAL
  Filled 2019-05-12: qty 2

## 2019-05-12 MED ORDER — ROSUVASTATIN CALCIUM 10 MG PO TABS
10.0000 mg | ORAL_TABLET | Freq: Every day | ORAL | Status: DC
  Filled 2019-05-12: qty 1

## 2019-05-12 MED ORDER — EPHEDRINE 5 MG/ML INJ
INTRAVENOUS | Status: AC
  Filled 2019-05-12: qty 20

## 2019-05-12 MED ORDER — FENTANYL CITRATE (PF) 250 MCG/5ML IJ SOLN
INTRAMUSCULAR | Status: AC
  Filled 2019-05-12: qty 5

## 2019-05-12 MED ORDER — SODIUM CHLORIDE 0.9 % IR SOLN
Status: DC | PRN
  Administered 2019-05-12: 3000 mL via INTRAVESICAL

## 2019-05-12 MED ORDER — OXYCODONE HCL 5 MG PO TABS
5.0000 mg | ORAL_TABLET | ORAL | Status: DC | PRN
  Filled 2019-05-12: qty 1

## 2019-05-12 MED ORDER — LIDOCAINE 2% (20 MG/ML) 5 ML SYRINGE
INTRAMUSCULAR | Status: AC
  Filled 2019-05-12: qty 5

## 2019-05-12 MED ORDER — ONDANSETRON HCL 4 MG/2ML IJ SOLN
INTRAMUSCULAR | Status: DC | PRN
  Administered 2019-05-12: 4 mg via INTRAVENOUS

## 2019-05-12 MED ORDER — ACETAMINOPHEN 500 MG PO TABS
ORAL_TABLET | ORAL | Status: AC
  Filled 2019-05-12: qty 2

## 2019-05-12 MED ORDER — LACTATED RINGERS IV SOLN
INTRAVENOUS | Status: DC
  Filled 2019-05-12: qty 1000

## 2019-05-12 MED ORDER — SODIUM CHLORIDE 0.9 % IV SOLN
INTRAVENOUS | Status: DC
  Filled 2019-05-12: qty 1000

## 2019-05-12 MED ORDER — PROPOFOL 10 MG/ML IV BOLUS
INTRAVENOUS | Status: DC | PRN
  Administered 2019-05-12: 200 mg via INTRAVENOUS

## 2019-05-12 MED ORDER — FENTANYL CITRATE (PF) 100 MCG/2ML IJ SOLN
25.0000 ug | INTRAMUSCULAR | Status: DC | PRN
  Filled 2019-05-12: qty 1

## 2019-05-12 MED ORDER — SODIUM CHLORIDE 0.9 % IR SOLN
3000.0000 mL | Status: DC
  Filled 2019-05-12: qty 3000

## 2019-05-12 MED ORDER — SENNOSIDES-DOCUSATE SODIUM 8.6-50 MG PO TABS
1.0000 | ORAL_TABLET | Freq: Two times a day (BID) | ORAL | Status: DC
  Administered 2019-05-12: 1 via ORAL
  Filled 2019-05-12: qty 1

## 2019-05-12 MED ORDER — OLMESARTAN MEDOXOMIL-HCTZ 40-25 MG PO TABS: 1.0000 | ORAL_TABLET | Freq: Every day | ORAL | Status: DC

## 2019-05-12 MED ORDER — DEXAMETHASONE SODIUM PHOSPHATE 10 MG/ML IJ SOLN
INTRAMUSCULAR | Status: DC | PRN
  Administered 2019-05-12: 10 mg via INTRAVENOUS

## 2019-05-12 MED ORDER — ROCURONIUM BROMIDE 10 MG/ML (PF) SYRINGE
PREFILLED_SYRINGE | INTRAVENOUS | Status: AC
  Filled 2019-05-12: qty 10

## 2019-05-12 MED ORDER — FENTANYL CITRATE (PF) 250 MCG/5ML IJ SOLN
INTRAMUSCULAR | Status: DC | PRN
  Administered 2019-05-12 (×4): 50 ug via INTRAVENOUS

## 2019-05-12 MED ORDER — HYDROMORPHONE HCL 1 MG/ML IJ SOLN
0.5000 mg | INTRAMUSCULAR | Status: DC | PRN
  Filled 2019-05-12: qty 1

## 2019-05-12 MED ORDER — LIDOCAINE 2% (20 MG/ML) 5 ML SYRINGE
INTRAMUSCULAR | Status: DC | PRN
  Administered 2019-05-12: 100 mg via INTRAVENOUS

## 2019-05-12 MED ORDER — PROPOFOL 10 MG/ML IV BOLUS
INTRAVENOUS | Status: AC
  Filled 2019-05-12: qty 20

## 2019-05-12 MED ORDER — GENTAMICIN SULFATE 40 MG/ML IJ SOLN
5.0000 mg/kg | INTRAVENOUS | Status: AC
  Administered 2019-05-12: 450 mg via INTRAVENOUS
  Filled 2019-05-12 (×2): qty 11.25

## 2019-05-12 SURGICAL SUPPLY — 32 items
BAG DRAIN URO-CYSTO SKYTR STRL (DRAIN) ×3 IMPLANT
BAG DRN RND TRDRP ANRFLXCHMBR (UROLOGICAL SUPPLIES) ×2
BAG DRN UROCATH (DRAIN) ×2
BAG URINE DRAIN 2000ML AR STRL (UROLOGICAL SUPPLIES) ×3 IMPLANT
BAG URINE LEG 500ML (DRAIN) IMPLANT
CATH FOLEY 2WAY SLVR  5CC 22FR (CATHETERS)
CATH FOLEY 2WAY SLVR 30CC 20FR (CATHETERS) IMPLANT
CATH FOLEY 2WAY SLVR 5CC 22FR (CATHETERS) IMPLANT
CATH HEMA 3WAY 30CC 22FR COUDE (CATHETERS) ×1 IMPLANT
CATH HEMA 3WAY 30CC 24FR RND (CATHETERS) ×1 IMPLANT
CLOTH BEACON ORANGE TIMEOUT ST (SAFETY) ×3 IMPLANT
ELECT BIVAP BIPO 22/24 DONUT (ELECTROSURGICAL)
ELECT REM PT RETURN 9FT ADLT (ELECTROSURGICAL) ×3
ELECTRD BIVAP BIPO 22/24 DONUT (ELECTROSURGICAL) IMPLANT
ELECTRODE REM PT RTRN 9FT ADLT (ELECTROSURGICAL) ×2 IMPLANT
EVACUATOR MICROVAS BLADDER (UROLOGICAL SUPPLIES) IMPLANT
GLOVE BIO SURGEON STRL SZ7.5 (GLOVE) ×3 IMPLANT
GOWN STRL REUS W/ TWL LRG LVL3 (GOWN DISPOSABLE) ×2 IMPLANT
GOWN STRL REUS W/TWL LRG LVL3 (GOWN DISPOSABLE) ×3
GUIDEWIRE STR DUAL SENSOR (WIRE) ×1 IMPLANT
HOLDER FOLEY CATH W/STRAP (MISCELLANEOUS) ×3 IMPLANT
IV CATH 18G SAFETY (IV SOLUTION) ×3 IMPLANT
IV NS IRRIG 3000ML ARTHROMATIC (IV SOLUTION) ×9 IMPLANT
KIT TURNOVER CYSTO (KITS) ×3 IMPLANT
LOOP CUT BIPOLAR 24F LRG (ELECTROSURGICAL) ×1 IMPLANT
MANIFOLD NEPTUNE II (INSTRUMENTS) ×3 IMPLANT
PACK CYSTO (CUSTOM PROCEDURE TRAY) ×3 IMPLANT
SYR 30ML LL (SYRINGE) ×1 IMPLANT
SYR TOOMEY IRRIG 70ML (MISCELLANEOUS) ×3
SYRINGE TOOMEY IRRIG 70ML (MISCELLANEOUS) ×2 IMPLANT
TUBE CONNECTING 12X1/4 (SUCTIONS) ×3 IMPLANT
TUBING UROLOGY SET (TUBING) ×3 IMPLANT

## 2019-05-12 NOTE — Anesthesia Preprocedure Evaluation (Addendum)
Anesthesia Evaluation  Patient identified by MRN, date of birth, ID band Patient awake    Reviewed: Allergy & Precautions, NPO status , Patient's Chart, lab work & pertinent test results  Airway Mallampati: III  TM Distance: >3 FB Neck ROM: Full    Dental  (+) Edentulous Upper, Edentulous Lower   Pulmonary former smoker, PE   Pulmonary exam normal breath sounds clear to auscultation       Cardiovascular hypertension, Pt. on medications Normal cardiovascular exam Rhythm:Regular Rate:Normal  HLD   Neuro/Psych negative neurological ROS  negative psych ROS   GI/Hepatic negative GI ROS, Neg liver ROS,   Endo/Other  negative endocrine ROS  Renal/GU negative Renal ROS  negative genitourinary   Musculoskeletal  (+) Arthritis ,   Abdominal   Peds  Hematology  (+) Blood dyscrasia (on xarelto), ,   Anesthesia Other Findings Recurrent bladder CA  Reproductive/Obstetrics                            Anesthesia Physical Anesthesia Plan  ASA: III  Anesthesia Plan: General   Post-op Pain Management:    Induction: Intravenous  PONV Risk Score and Plan: 2 and Midazolam, Dexamethasone and Ondansetron  Airway Management Planned: LMA  Additional Equipment:   Intra-op Plan:   Post-operative Plan: Extubation in OR  Informed Consent: I have reviewed the patients History and Physical, chart, labs and discussed the procedure including the risks, benefits and alternatives for the proposed anesthesia with the patient or authorized representative who has indicated his/her understanding and acceptance.     Dental advisory given  Plan Discussed with: CRNA  Anesthesia Plan Comments:        Anesthesia Quick Evaluation

## 2019-05-12 NOTE — Brief Op Note (Signed)
05/12/2019  1:00 PM  PATIENT:  Gerald Morales  69 y.o. adult  PRE-OPERATIVE DIAGNOSIS:  RECURRENT BLADDER CANCER  POST-OPERATIVE DIAGNOSIS:  RECURRENT BLADDER CANCER  PROCEDURE:  Procedure(s) with comments: TRANSURETHRAL RESECTION OF THE PROSTATE (TURP) (N/A) - 75 MINS TRANSURETHRAL RESECTION OF BLADDER TUMOR (TURBT) (N/A)  SURGEON:  Surgeon(s) and Role:    * [name], MD - Primary  PHYSICIAN ASSISTANT:   ASSISTANTS: none   ANESTHESIA:   general  EBL:  100 mL   BLOOD ADMINISTERED:none  DRAINS: 48F 3 way fole to NS irrigation   LOCAL MEDICATIONS USED:  NONE  SPECIMEN:  Source of Specimen:  1 - bladder tumor; 2 - prostate chips  DISPOSITION OF SPECIMEN:  PATHOLOGY  COUNTS:  YES  TOURNIQUET:  * No tourniquets in log *  DICTATION: .Other Dictation: Dictation Number [id number]  PLAN OF CARE: Admit for overnight observation  PATIENT DISPOSITION:  PACU - hemodynamically stable.   Delay start of Pharmacological VTE agent (>24hrs) due to surgical blood loss or risk of bleeding: yes

## 2019-05-12 NOTE — H&P (Signed)
Gerald Morales is an 69 y.o. adult.    Chief Complaint: Pre-op TURP + Transurethral Resection of Bladder Tumor  HPI:   1 - Low Grade Bladder Cancer - TaG1 by TURBT 09/2010.   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   2 - Lower Urinary Tract Symptoms / Nocturia - Progressive bother from irritative and obstructive symptoms with nocuturia x 4-5. DRE 2021 60gm. He has significant LE edema and drinkns fluids until bedtime. UA 2021 w/o infection. PVR 2021 " 277 mL" (modest elevation)    PMH sig for DVT/PE/Xarelto (follows Gerald Morales), orhto surgeries, umbil hernia repair. His PCP is Gerald Pretty MD.    Today " Del " is seen to proceed with transurethral resection of bladder neck tumro and TURP. He has held Xarelto as instructed. Most recent UA without infectious parameters.      Past Medical History:  Diagnosis Date  . Anticoagulant long-term use    xarelto   . History of chest pain    all documented in epic/   with cardiac evaluation's in 2008 and 2019  (10-07-2006 negative myoview for ischemia, ef 57% pt released prn by dr Gerald Morales, note in [place])  and (evaluated 06-16-2017 by Gerald Barrios PA, atypical cp CTA 08-03-2017 calcium score 18 w/ miminal nonob CAD w/ trivial AV calcification, pt release prn)   . History of diverticulitis of colon 2016  . History of pulmonary embolus (PE) previously followed by hematology/ oncology--- dr Alen Blew;   now followed by pcp   07/ 2015  bilateral PE, provoked by long travel and smoking;  long term anticoagulant recommended  due to positive lupus anticoagulant  . Hypertension    followed by pcp  . Lower urinary tract symptoms (LUTS)   . Lupus anticoagulant positive    2015  . Mixed hyperlipidemia   . 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 OA (osteoarthritis)   . Recurrent malignant neoplasm of bladder [facility])    urologist-- dr Gerald Morales--- first dx 2012  s/p  TURBT    Past Surgical History:  Procedure Laterality Date  . LACERATION REPAIR  yrs ago   repair complex laceration/ repair  left palm injury  . REVISION AMPUTATION OF FINGER  yrs ago   right index finger  . SEPTOPLASTY  yrs ago  . THYROID LOBECTOMY Left 03-22-2007    dr Gerald Morales Done @[facility]    AND ISTHMUSECTOMY  . TRANSURETHRAL RESECTION OF BLADDER TUMOR  10-21-2010  dr Jasmine December @[facility]   . UMBILICAL HERNIA REPAIR  05-05-2010   DR Gerald Morales Done @[facility]     Family History  Problem Relation Age of Onset  . Hypertension Mother   . Diabetes Mother   . Hypertension Father    Social History:  reports that she quit smoking about 6 years ago. Her smoking use included cigarettes, pipe, and cigars. She has a 70.00 pack-year smoking history. She quit smokeless tobacco use about 6 years ago.  Her smokeless tobacco use included chew. She reports current alcohol use of about 14.0 standard drinks of alcohol per week. She reports that she  does not use drugs.  Allergies:  Allergies  Allergen Reactions  . Lipitor [Atorvastatin] Anxiety and Other (See Comments)    Patient stated,"anxiety, increased heart rate, nervousness."  . Vytorin [Ezetimibe-Simvastatin] Anxiety and Other (See Comments)    Patient stated,"anxiety, increased heart rate, nervousness."    No medications prior to admission.    No results found for this or any previous visit (from the past 48 hour(s)). No results found.  Review of Systems  Constitutional: Negative for chills and fever.  Genitourinary: Positive for urgency.  All other systems reviewed and are negative.   Height 5\' 11"  (1.803 m), weight 120.2 kg. Physical Exam  Constitutional: She appears well-developed.   HENT:  Head: Normocephalic.  Eyes: Pupils are equal, round, and reactive to light.  Cardiovascular: Normal rate.  Respiratory: Effort normal.  GI: Soft.  Genitourinary:    Genitourinary Comments: NO CVAT at present.    Musculoskeletal:        General: Normal range of motion.     Cervical back: Normal range of motion.  Neurological: She is alert.  Psychiatric: She has a normal mood and affect.     Assessment/Plan  Proceed as planned with TURP + TURBT. Risks, benefits, alternatives, expected peri-op course discussed previously and reiterated today.   Gerald Frock, MD 05/12/2019, 8:08 AM

## 2019-05-12 NOTE — Transfer of Care (Signed)
Immediate Anesthesia Transfer of Care Note  Patient: Gerald Morales  Procedure(s) Performed: TRANSURETHRAL RESECTION OF THE PROSTATE (TURP) (N/A Prostate) TRANSURETHRAL RESECTION OF BLADDER TUMOR (TURBT) (N/A Bladder)  Patient Location: PACU  Anesthesia Type:General  Level of Consciousness: awake and alert   Airway & Oxygen Therapy: Patient Spontanous Breathing and Patient connected to face mask oxygen  Post-op Assessment: Report given to RN and Post -op Vital signs reviewed and stable  Post vital signs: Reviewed and stable  Last Vitals:  Vitals Value Taken Time  BP 131/93 05/12/19 1309  Temp    Pulse 83 05/12/19 1312  Resp 13 05/12/19 1312  SpO2 95 % 05/12/19 1312  Vitals shown include unvalidated device data.  Last Pain:  Vitals:   05/12/19 1033  TempSrc: Oral         Complications: No apparent anesthesia complications

## 2019-05-12 NOTE — Anesthesia Procedure Notes (Signed)
Procedure Name: LMA Insertion Date/Time: 05/12/2019 12:10 PM Performed by: Cynda Familia, CRNA Pre-anesthesia Checklist: Patient identified, Emergency Drugs available, Suction available and Patient being monitored Patient Re-evaluated:Patient Re-evaluated prior to induction Oxygen Delivery Method: Circle System Utilized Preoxygenation: Pre-oxygenation with 100% oxygen Induction Type: IV induction Ventilation: Mask ventilation without difficulty LMA: LMA inserted and LMA with gastric port inserted LMA Size: 5.0 Number of attempts: 1 Placement Confirmation: positive ETCO2 Tube secured with: Tape Dental Injury: Teeth and Oropharynx as per pre-operative assessment  Comments: Smooth iv induction woodrum lma am crna atraumatic  Teeth and mouth aspreop

## 2019-05-12 NOTE — Anesthesia Postprocedure Evaluation (Signed)
Anesthesia Post Note  Patient: Gerald Morales  Procedure(s) Performed: TRANSURETHRAL RESECTION OF THE PROSTATE (TURP) (N/A Prostate) TRANSURETHRAL RESECTION OF BLADDER TUMOR (TURBT) (N/A Bladder)     Patient location during evaluation: PACU Anesthesia Type: General Level of consciousness: sedated Pain management: pain level controlled Vital Signs Assessment: post-procedure vital signs reviewed and stable Respiratory status: spontaneous breathing and respiratory function stable Cardiovascular status: stable Postop Assessment: no apparent nausea or vomiting Anesthetic complications: no    Last Vitals:  Vitals:   05/12/19 1400 05/12/19 1420  BP: 120/63 140/86  Pulse: 79 81  Resp: 15   Temp:  (!) 36.3 C  SpO2: 96% 95%    Last Pain:  Vitals:   05/12/19 1400  TempSrc:   PainSc: 0-No pain                 Xaidyn Kepner DANIEL

## 2019-05-12 NOTE — Anesthesia Procedure Notes (Signed)
Date/Time: 05/12/2019 1:01 PM Performed by: Cynda Familia, CRNA Oxygen Delivery Method: Simple face mask Placement Confirmation: positive ETCO2 and breath sounds checked- equal and bilateral Dental Injury: Teeth and Oropharynx as per pre-operative assessment

## 2019-05-13 MED ORDER — ACETAMINOPHEN 500 MG PO TABS
ORAL_TABLET | ORAL | Status: AC
  Filled 2019-05-13: qty 2

## 2019-05-13 NOTE — Discharge Instructions (Signed)
Indwelling Urinary Catheter Care, Adult An indwelling urinary catheter is a thin tube that is put into your bladder. The tube helps to drain pee (urine) out of your body. The tube goes in through your urethra. Your urethra is where pee comes out of your body. Your pee will come out through the catheter, then it will go into a bag (drainage bag). Take good care of your catheter so it will work well. How to wear your catheter and bag Supplies needed  Sticky tape (adhesive tape) or a leg strap.  Alcohol wipe or soap and water (if you use tape).  A clean towel (if you use tape).  Large overnight bag.  Smaller bag (leg bag). Wearing your catheter Attach your catheter to your leg with tape or a leg strap.  Make sure the catheter is not pulled tight.  If a leg strap gets wet, take it off and put on a dry strap.  If you use tape to hold the bag on your leg: 1. Use an alcohol wipe or soap and water to wash your skin where the tape made it sticky before. 2. Use a clean towel to pat-dry that skin. 3. Use new tape to make the bag stay on your leg. Wearing your bags You should have been given a large overnight bag.  You may wear the overnight bag in the day or night.  Always have the overnight bag lower than your bladder.  Do not let the bag touch the floor.  Before you go to sleep, put a clean plastic bag in a wastebasket. Then hang the overnight bag inside the wastebasket. You should also have a smaller leg bag that fits under your clothes.  Always wear the leg bag below your knee.  Do not wear your leg bag at night. How to care for your skin and catheter Supplies needed  A clean washcloth.  Water and mild soap.  A clean towel. Caring for your skin and catheter      Clean the skin around your catheter every day: 1. Wash your hands with soap and water. 2. Wet a clean washcloth in warm water and mild soap. 3. Clean the skin around your urethra.  If you are  male:  Gently spread the folds of skin around your vagina (labia).  With the washcloth in your other hand, wipe the inner side of your labia on each side. Wipe from front to back.  If you are male:  Pull back any skin that covers the end of your penis (foreskin).  With the washcloth in your other hand, wipe your penis in small circles. Start wiping at the tip of your penis, then move away from the catheter.  Move the foreskin back in place, if needed. 4. With your free hand, hold the catheter close to where it goes into your body.  Keep holding the catheter during cleaning so it does not get pulled out. 5. With the washcloth in your other hand, clean the catheter.  Only wipe downward on the catheter.  Do not wipe upward toward your body. Doing this may push germs into your urethra and cause infection. 6. Use a clean towel to pat-dry the catheter and the skin around it. Make sure to wipe off all soap. 7. Wash your hands with soap and water.  Shower every day. Do not take baths.  Do not use cream, ointment, or lotion on the area where the catheter goes into your body, unless your doctor tells you   to.  Do not use powders, sprays, or lotions on your genital area.  Check your skin around the catheter every day for signs of infection. Check for: ? Redness, swelling, or pain. ? Fluid or blood. ? Warmth. ? Pus or a bad smell. How to empty the bag Supplies needed  Rubbing alcohol.  Gauze pad or cotton ball.  Tape or a leg strap. Emptying the bag Pour the pee out of your bag when it is ?- full, or at least 2-3 times a day. Do this for your overnight bag and your leg bag. 1. Wash your hands with soap and water. 2. Separate (detach) the bag from your leg. 3. Hold the bag over the toilet or a clean pail. Keep the bag lower than your hips and bladder. This is so the pee (urine) does not go back into the tube. 4. Open the pour spout. It is at the bottom of the bag. 5. Empty the  pee into the toilet or pail. Do not let the pour spout touch any surface. 6. Put rubbing alcohol on a gauze pad or cotton ball. 7. Use the gauze pad or cotton ball to clean the pour spout. 8. Close the pour spout. 9. Attach the bag to your leg with tape or a leg strap. 10. Wash your hands with soap and water. Follow instructions for cleaning the drainage bag:  From the product maker.  As told by your doctor. How to change the bag Supplies needed  Alcohol wipes.  A clean bag.  Tape or a leg strap. Changing the bag Replace your bag when it starts to leak, smell bad, or look dirty. 1. Wash your hands with soap and water. 2. Separate the dirty bag from your leg. 3. Pinch the catheter with your fingers so that pee does not spill out. 4. Separate the catheter tube from the bag tube where these tubes connect (at the connection valve). Do not let the tubes touch any surface. 5. Clean the end of the catheter tube with an alcohol wipe. Use a different alcohol wipe to clean the end of the bag tube. 6. Connect the catheter tube to the tube of the clean bag. 7. Attach the clean bag to your leg with tape or a leg strap. Do not make the bag tight on your leg. 8. Wash your hands with soap and water. General rules   Never pull on your catheter. Never try to take it out. Doing that can hurt you.  Always wash your hands before and after you touch your catheter or bag. Use a mild, fragrance-free soap. If you do not have soap and water, use hand sanitizer.  Always make sure there are no twists or bends (kinks) in the catheter tube.  Always make sure there are no leaks in the catheter or bag.  Drink enough fluid to keep your pee pale yellow.  Do not take baths, swim, or use a hot tub.  If you are male, wipe from front to back after you poop (have a bowel movement). Contact a doctor if:  Your pee is cloudy.  Your pee smells worse than usual.  Your catheter gets clogged.  Your catheter  leaks.  Your bladder feels full. Get help right away if:  You have redness, swelling, or pain where the catheter goes into your body.  You have fluid, blood, pus, or a bad smell coming from the area where the catheter goes into your body.  Your skin feels warm where   the catheter goes into your body.  You have a fever.  You have pain in your: ? Belly (abdomen). ? Legs. ? Lower back. ? Bladder.  You see blood in the catheter.  Your pee is pink or red.  You feel sick to your stomach (nauseous).  You throw up (vomit).  You have chills.  Your pee is not draining into the bag.  Your catheter gets pulled out. Summary  An indwelling urinary catheter is a thin tube that is placed into the bladder to help drain pee (urine) out of the body.  The catheter is placed into the part of the body that drains pee from the bladder (urethra).  Taking good care of your catheter will keep it working properly and help prevent problems.  Always wash your hands before and after touching your catheter or bag.  Never pull on your catheter or try to take it out. This information is not intended to replace advice given to you by your health care provider. Make sure you discuss any questions you have with your health care provider. Document Revised: 06/03/2018 Document Reviewed: 09/25/2016 Elsevier Patient Education  Joshua may have urinary urgency (bladder spasms) and bloody urine on / off for up to 2 weeks.  This is normal.  2 - Call MD or go to ER for fever >102, severe pain / nausea / vomiting not relieved by medications, or acute change in medical status

## 2019-05-13 NOTE — Op Note (Signed)
NAME: Gerald Morales, [facility]IL[facility] MEDICAL RECORD [id number] ACCOUNT [id number] DATE OF BIRTH:Dec 30, 1950 FACILITY: [facility] LOCATION: [facility] David Stall, MD  OPERATIVE REPORT  DATE OF PROCEDURE:  05/12/2019  SURGEON:  [name], MD  PREOPERATIVE DIAGNOSES:   1.  Recurrent bladder tumor. 2.  Obstructive voiding. 3.   Bilobar prostatic hypertrophy.  PROCEDURES:   1.  Transurethral resection of bladder tumor, volume medium. 2.  Transurethral resection of prostate.   ESTIMATED BLOOD LOSS:  100 mL.  COMPLICATIONS:  None.  SPECIMENS: 1.  Bladder tumor for pathology. 2.  Prostate chips for pathology.  FINDINGS: 1.  Very high riding bladder neck. 2.  Bilobar prostatic hypertrophy. 3.  Ball-valving approximately 4 cm papillary bladder tumor at the 5 o'clock position from bladder neck-prostate area. 4.  Wide open prostatic fossa from the ureter to the bladder neck posterior dissection.    CATHETER:  A 22-French 3-way Foley catheter to normal saline irrigation, 20 mL sterile in the balloon.  INDICATIONS:  The patient is a pleasant but relatively noncompliant 69 year old man with longstanding history of superficial bladder cancer.  He represented as new patient for obstructing voiding symptoms and he failed several trials of oral therapy  based on presumed outlet obstruction.  He was found to have a modest residual urine, but cystoscopically to have a significant volume bladder tumor recurrence of a papillary tumor approximately 4 cm in a ball-valving orientation of this bladder neck just  to the right of midline inferior.  After discussion, we have recommended path of transection of bladder tumor and TURP concomitantly given location tumor with goal of alleviating and improving his obstructive voiding and for treating his recurrent  bladder tumor.  Informed consent was obtained and placed in the medical record.  DESCRIPTION OF PROCEDURE:  The patient being identified, the  procedure being transurethral resection of bladder tumor and transurethral resection of the prostate was confirmed, procedure timeout was performed.  Intravenous IV antibiotics administered.   General LMA anesthesia introduced.  The patient was placed into a low lithotomy position.  A sterile field was created, prepping and draping his penis, perineum and proximal thighs using iodine.  Cystourethroscopy was performed using a 26-French  resectoscope sheath with visual obturator.  The anterior and posterior urethra revealed a very high riding bladder neck, bilobar prostatic hypertrophy, and as expected, a large ball-valving tumor at the 5 o'clock position of the bladder neck, estimated  size 4 cm.  The remainder of the bladder had mild trabeculation.  The ureteral orifices were in anatomic position, unremarkable.  No additional foci or neoplasm noted.  Next, using resectoscope loop with normal saline irrigation, very careful resection  was performed of the bladder tumor down to superficial fibromuscular stroma of the urinary bladder.  The bladder tumor fragments were set aside and labeled as such.  ____ the bladder was resected following this and there was found to be complete  resolution of all visible papillary tumors.  Attention was directed at transurethral resection of the prostate. This was performed in a top down orientation,  first in the 12 o'clock position from the bladder neck to the area of the verumontanum, down to  the superficial fibromuscular stroma of the prostatic capsule, then of the right lobe of the prostate, then of the left lobe of the prostate, again from the bladder neck to the verumontanum.  Exquisite care was taken to avoid undermining of the bladder  neck given his very high riding bladder neck anatomy and as such, dissection was  mostly lateral and superior with minimally inferior.  Following this, there was an excellent wide open prostatic fossa from the veru to the bladder neck.   Prostate chips  were irrigated and set aside for pathology.  Additional hemostasis was achieved with coagulation current of the entire prostatic fossa.  Next, a 22-French 3-way Foley catheter, hematuria-type was placed over a sensor wire to avoid trauma to the bladder  neck.  Twenty mL of water in the balloon was placed.  This was connected to normal saline irrigation.  Efflux was  light pink on slow irrigation.  Then the procedure was terminated.  The patient tolerated the procedure well.  No immediate perioperative  complications.  The patient was taken to postanesthesia care unit in stable condition with plan for observation overnignt, likely discharge home with catheter and a voiding trial next week.  VN/NUANCE  D:05/12/2019 T:05/12/2019 JOB:[id number]

## 2019-05-13 NOTE — Discharge Summary (Signed)
Patient ID: Gerald Morales MRN: [id number] DOB/AGE: 1950/11/19 69 y.o.  Admit date: 05/12/2019 Discharge date: 05/13/2019  Primary Care Physician:  Deland Pretty, MD  Discharge Diagnoses: BPH, bladder cancer Present on Admission: . Prostatic hyperplasia   Consults:  None     Discharge Medications: Allergies as of 05/13/2019      Reactions   Lipitor [atorvastatin] Anxiety, Other (See Comments)   Patient stated,"anxiety, increased heart rate, nervousness."   Vytorin [ezetimibe-simvastatin] Anxiety, Other (See Comments)   Patient stated,"anxiety, increased heart rate, nervousness."      Medication List    STOP taking these medications   icosapent Ethyl 1 g capsule Commonly known as: Vascepa   rivaroxaban 20 MG Tabs tablet Commonly known as: XARELTO     TAKE these medications   multivitamin capsule Take 1 capsule by mouth daily.   olmesartan-hydrochlorothiazide 40-25 MG tablet Commonly known as: BENICAR HCT Take 1 tablet by mouth daily.   oxyCODONE-acetaminophen 5-325 MG tablet Commonly known as: Percocet Take 1 tablet by mouth every 6 (six) hours as needed for moderate pain or severe pain. Post-operatively   rosuvastatin 20 MG tablet Commonly known as: CRESTOR Take 10 mg by mouth daily.   senna-docusate 8.6-50 MG tablet Commonly known as: Senokot-S Take 1 tablet by mouth 2 (two) times daily. While taking pain meds to prevent constipation   sulfamethoxazole-trimethoprim 800-160 MG tablet Commonly known as: BACTRIM DS Take 1 tablet by mouth 2 (two) times daily. X 3 days to prevent infection with catheter in place.        Significant Diagnostic Studies:  No results found.  Brief H and P: For complete details please refer to admission H and P, but in brief the patient is admitted for TURP in addition to transurethral resection of bladder tumor  Hospital Course: Postoperative course was unremarkable in this gentleman.  His CBI was stopped the morning of  discharge, postoperative day #1.  At that time he was felt to be ready for discharge. Active Problems:   Prostatic hyperplasia   Day of Discharge BP 140/78 (BP Location: Right Arm)   Pulse 77   Temp 97.6 F (36.4 C)   Resp 18   Ht 5\' 11"  (1.803 m)   Wt 118.9 kg   SpO2 94%   BMI 36.57 kg/m   Results for orders placed or performed during the hospital encounter of 05/12/19 (from the past 24 hour(s))  I-STAT, chem 8     Status: Abnormal   Collection Time: 05/12/19 11:05 AM  Result Value Ref Range   Sodium 140 135 - 145 mmol/L   Potassium 3.5 3.5 - 5.1 mmol/L   Chloride 102 98 - 111 mmol/L   BUN 20 8 - 23 mg/dL   Creatinine, Ser 1.20 0.61 - 1.24 mg/dL   Glucose, Bld 112 (H) 70 - 99 mg/dL   Calcium, Ion 1.24 1.15 - 1.40 mmol/L   TCO2 27 22 - 32 mmol/L   Hemoglobin 16.3 13.0 - 17.0 g/dL   HCT 48.0 39.0 - 52.0 %    Physical Exam: General: Alert and awake oriented x3 not in any acute distress. HEENT: anicteric sclera, pupils reactive to light and accommodation CVS: S1-S2 clear no murmur rubs or gallops Chest: clear to auscultation bilaterally, no wheezing rales or rhonchi Abdomen: soft nontender, nondistended, normal bowel sounds, no organomegaly Extremities: no cyanosis, clubbing or edema noted bilaterally Neuro: Cranial nerves II-XII intact, no focal neurological deficits  Disposition: Home  Diet: No restrictions  Activity: Restrictions discussed  with patient   Disposition and Follow-up:  Discharge Instructions    Diet general   Complete by: As directed    Increase activity slowly   Complete by: As directed        TESTS THAT NEED FOLLOW-UP  Pathology review  DISCHARGE FOLLOW-UP  Follow-up Information    [name], MD On 05/15/2019.   Specialty: Urology Why: at 1PM for MD visit and catheter removal.  Contact information: [place] [address] [phone]           Time spent on Discharge:  10 minutes  Signed: Lillette Boxer  Irais Mottram 05/13/2019, 9:01 AM

## 2019-05-15 DIAGNOSIS — N5201 Erectile dysfunction due to arterial insufficiency: Secondary | ICD-10-CM | POA: Diagnosis not present

## 2019-05-15 DIAGNOSIS — R3915 Urgency of urination: Secondary | ICD-10-CM | POA: Diagnosis not present

## 2019-05-15 DIAGNOSIS — C678 Malignant neoplasm of overlapping sites of bladder: Secondary | ICD-10-CM | POA: Diagnosis not present

## 2019-05-17 LAB — SURGICAL PATHOLOGY

## 2019-06-20 DIAGNOSIS — B957 Other staphylococcus as the cause of diseases classified elsewhere: Secondary | ICD-10-CM | POA: Diagnosis not present

## 2019-06-20 DIAGNOSIS — R310 Gross hematuria: Secondary | ICD-10-CM | POA: Diagnosis not present

## 2019-06-20 DIAGNOSIS — R3915 Urgency of urination: Secondary | ICD-10-CM | POA: Diagnosis not present

## 2019-06-20 DIAGNOSIS — C678 Malignant neoplasm of overlapping sites of bladder: Secondary | ICD-10-CM | POA: Diagnosis not present

## 2019-06-20 DIAGNOSIS — N390 Urinary tract infection, site not specified: Secondary | ICD-10-CM | POA: Diagnosis not present

## 2019-08-14 DIAGNOSIS — N5201 Erectile dysfunction due to arterial insufficiency: Secondary | ICD-10-CM | POA: Diagnosis not present

## 2019-08-14 DIAGNOSIS — N35013 Post-traumatic anterior urethral stricture: Secondary | ICD-10-CM | POA: Diagnosis not present

## 2019-08-14 DIAGNOSIS — C678 Malignant neoplasm of overlapping sites of bladder: Secondary | ICD-10-CM | POA: Diagnosis not present

## 2019-08-14 DIAGNOSIS — R3915 Urgency of urination: Secondary | ICD-10-CM | POA: Diagnosis not present

## 2019-08-16 ENCOUNTER — Other Ambulatory Visit: Payer: Self-pay | Admitting: Urology

## 2019-08-17 NOTE — Progress Notes (Signed)
PCP - Deland Pretty ,MD Cardiologist - Ermalinda Barrios PA-C lov 08-17-2017  epic  PPM/ICD -  Device Orders -  Rep Notified -   Chest x-ray -  EKG - 05-12-19 epic Stress Test -  ECHO -  Cardiac Cath -   Sleep Study -  CPAP -   Fasting Blood Sugar -  Checks Blood Sugar _____ times a day  Blood Thinner Instructions:xarelto stop 08-19-19 Aspirin Instructions:  ERAS Protcol - PRE-SURGERY Ensure or G2-   COVID TEST- 08-19-19   Anesthesia review: Hx PE, HTN bladder cancer  Patient denies shortness of breath, fever, cough and chest pain at PAT appointment   none   All instructions explained to the patient, with a verbal understanding of the material. Patient agrees to go over the instructions while at home for a better understanding. Patient also instructed to self quarantine after being tested for COVID-19. The opportunity to ask questions was provided.

## 2019-08-17 NOTE — Patient Instructions (Addendum)
DUE TO COVID-19 ONLY ONE VISITOR IS ALLOWED TO COME WITH YOU AND STAY IN THE WAITING ROOM ONLY DURING PRE OP AND PROCEDURE DAY OF SURGERY. TWO  VISITOR MAY VISIT WITH YOU AFTER SURGERY IN YOUR PRIVATE ROOM DURING VISITING HOURS ONLY!  10a-8p  YOU NEED TO HAVE A COVID 19 TEST ON__6-26-21_____ @  12:20_______, THIS TEST MUST BE DONE BEFORE SURGERY, COME  [address], [place] , [address].  ([place]) ONCE YOUR COVID TEST IS COMPLETED, PLEASE BEGIN THE QUARANTINE INSTRUCTIONS AS OUTLINED IN YOUR HANDOUT.                Gerald Morales  08/17/2019   Your procedure is scheduled on: 08-23-19   Report to [facility] Main  Entrance   Report to admitting at       [phone]  AM     Call this number if you have problems the morning of surgery [phone]    Remember: Do not eat food or drink liquids :After Midnight.  BRUSH YOUR TEETH MORNING OF SURGERY AND RINSE YOUR MOUTH OUT, NO CHEWING GUM CANDY OR MINTS.     Take these medicines the morning of surgery with A SIP OF WATER: crestor                                 You may not have any metal on your body including hair pins and              piercings  Do not wear jewelry,  lotions, powders or perfumes, deodorant                     Men may shave face and neck.   Do not bring valuables to the hospital. [place].  Contacts, dentures or bridgework may not be worn into surgery.      Patients discharged the day of surgery will not be allowed to drive home. IF YOU ARE HAVING SURGERY AND GOING HOME THE SAME DAY, YOU MUST HAVE AN ADULT TO DRIVE YOU HOME AND BE WITH YOU FOR 24 HOURS. YOU MAY GO HOME BY TAXI OR UBER OR ORTHERWISE, BUT AN ADULT MUST ACCOMPANY YOU HOME AND STAY WITH YOU FOR 24 HOURS.  Name and phone number of your driver:  Special Instructions: N/A              Please read over the following fact sheets you were  given: _____________________________________________________________________             [facility] - Preparing for Surgery Before surgery, you can play an important role.  Because skin is not sterile, your skin needs to be as free of germs as possible.  You can reduce the number of germs on your skin by washing with CHG (chlorahexidine gluconate) soap before surgery.  CHG is an antiseptic cleaner which kills germs and bonds with the skin to continue killing germs even after washing. Please DO NOT use if you have an allergy to CHG or antibacterial soaps.  If your skin becomes reddened/irritated stop using the CHG and inform your nurse when you arrive at Short Stay. Do not shave (including legs and underarms) for at least 48 hours prior to the first CHG shower.  You may shave your face/neck. Please follow  these instructions carefully:  1.  Shower with CHG Soap the night before surgery and the  morning of Surgery.  2.  If you choose to wash your hair, wash your hair first as usual with your  normal  shampoo.  3.  After you shampoo, rinse your hair and body thoroughly to remove the  shampoo.                           4.  Use CHG as you would any other liquid soap.  You can apply chg directly  to the skin and wash                       Gently with a scrungie or clean washcloth.  5.  Apply the CHG Soap to your body ONLY FROM THE NECK DOWN.   Do not use on face/ open                           Wound or open sores. Avoid contact with eyes, ears mouth and genitals (private parts).                       Wash face,  Genitals (private parts) with your normal soap.             6.  Wash thoroughly, paying special attention to the area where your surgery  will be performed.  7.  Thoroughly rinse your body with warm water from the neck down.  8.  DO NOT shower/wash with your normal soap after using and rinsing off  the CHG Soap.                9.  Pat yourself dry with a clean towel.            10.  Wear  clean pajamas.            11.  Place clean sheets on your bed the night of your first shower and do not  sleep with pets. Day of Surgery : Do not apply any lotions/deodorants the morning of surgery.  Please wear clean clothes to the hospital/surgery center.  FAILURE TO FOLLOW THESE INSTRUCTIONS MAY RESULT IN THE CANCELLATION OF YOUR SURGERY PATIENT SIGNATURE_________________________________  NURSE SIGNATURE__________________________________  ________________________________________________________________________

## 2019-08-18 ENCOUNTER — Other Ambulatory Visit: Payer: Self-pay

## 2019-08-18 ENCOUNTER — Encounter (HOSPITAL_COMMUNITY): Payer: Self-pay

## 2019-08-18 ENCOUNTER — Encounter (HOSPITAL_COMMUNITY)
Admission: RE | Admit: 2019-08-18 | Discharge: 2019-08-18 | Disposition: A | Discharge status: Home / Self Care | Payer: BC Managed Care – PPO | Source: Ambulatory Visit | Attending: Urology | Admitting: Urology

## 2019-08-18 DIAGNOSIS — I2510 Atherosclerotic heart disease of native coronary artery without angina pectoris: Secondary | ICD-10-CM | POA: Insufficient documentation

## 2019-08-18 DIAGNOSIS — Z86711 Personal history of pulmonary embolism: Secondary | ICD-10-CM | POA: Insufficient documentation

## 2019-08-18 DIAGNOSIS — E890 Postprocedural hypothyroidism: Secondary | ICD-10-CM | POA: Diagnosis not present

## 2019-08-18 DIAGNOSIS — Z01818 Encounter for other preprocedural examination: Secondary | ICD-10-CM | POA: Insufficient documentation

## 2019-08-18 DIAGNOSIS — N35919 Unspecified urethral stricture, male, unspecified site: Secondary | ICD-10-CM | POA: Diagnosis not present

## 2019-08-18 DIAGNOSIS — I10 Essential (primary) hypertension: Secondary | ICD-10-CM | POA: Insufficient documentation

## 2019-08-18 DIAGNOSIS — Z79899 Other long term (current) drug therapy: Secondary | ICD-10-CM | POA: Diagnosis not present

## 2019-08-18 DIAGNOSIS — Z7901 Long term (current) use of anticoagulants: Secondary | ICD-10-CM | POA: Diagnosis not present

## 2019-08-18 DIAGNOSIS — Z8551 Personal history of malignant neoplasm of bladder: Secondary | ICD-10-CM | POA: Diagnosis not present

## 2019-08-18 HISTORY — DX: Other specified postprocedural states: Z98.890

## 2019-08-18 HISTORY — DX: Atherosclerotic heart disease of native coronary artery without angina pectoris: I25.10

## 2019-08-18 LAB — BASIC METABOLIC PANEL
Anion gap: 10 (ref 5–15)
BUN: 16 mg/dL (ref 8–23)
CO2: 25 mmol/L (ref 22–32)
Calcium: 9.5 mg/dL (ref 8.9–10.3)
Chloride: 104 mmol/L (ref 98–111)
Creatinine, Ser: 0.96 mg/dL (ref 0.61–1.24)
GFR calc Af Amer: >60 mL/min (ref >60)
GFR calc non Af Amer: >60 mL/min (ref >60)
Glucose, Bld: 110 mg/dL — ABNORMAL HIGH (ref 70–99)
Potassium: 3.4 mmol/L — ABNORMAL LOW (ref 3.5–5.1)
Sodium: 139 mmol/L (ref 135–145)

## 2019-08-18 LAB — CBC
HCT: 47.3 % (ref 39.0–52.0)
Hemoglobin: 16 g/dL (ref 13.0–17.0)
MCH: 29.9 pg (ref 26.0–34.0)
MCHC: 33.8 g/dL (ref 30.0–36.0)
MCV: 88.4 fL (ref 80.0–100.0)
Platelets: 195 10*3/uL (ref 150–400)
RBC: 5.35 MIL/uL (ref 4.22–5.81)
RDW: 13.4 % (ref 11.5–15.5)
WBC: 7.2 10*3/uL (ref 4.0–10.5)
nRBC: 0 % (ref 0.0–0.2)

## 2019-08-19 ENCOUNTER — Other Ambulatory Visit (HOSPITAL_COMMUNITY)
Admission: RE | Admit: 2019-08-19 | Discharge: 2019-08-19 | Disposition: A | Discharge status: Home / Self Care | Payer: BC Managed Care – PPO | Source: Ambulatory Visit | Attending: Urology | Admitting: Urology

## 2019-08-19 DIAGNOSIS — Z01812 Encounter for preprocedural laboratory examination: Secondary | ICD-10-CM | POA: Insufficient documentation

## 2019-08-19 DIAGNOSIS — Z20822 Contact with and (suspected) exposure to covid-19: Secondary | ICD-10-CM | POA: Insufficient documentation

## 2019-08-19 LAB — SARS CORONAVIRUS 2 (TAT 6-24 HRS): SARS Coronavirus 2: NEGATIVE

## 2019-08-21 ENCOUNTER — Encounter (HOSPITAL_COMMUNITY): Payer: Self-pay

## 2019-08-21 DIAGNOSIS — Z7901 Long term (current) use of anticoagulants: Secondary | ICD-10-CM | POA: Diagnosis not present

## 2019-08-21 DIAGNOSIS — Q6431 Congenital bladder neck obstruction: Secondary | ICD-10-CM | POA: Diagnosis not present

## 2019-08-21 DIAGNOSIS — Z86711 Personal history of pulmonary embolism: Secondary | ICD-10-CM | POA: Diagnosis not present

## 2019-08-21 NOTE — Anesthesia Preprocedure Evaluation (Addendum)
Anesthesia Evaluation  Patient identified by MRN, date of birth, ID band Patient awake    Reviewed: Allergy & Precautions, NPO status , Patient's Chart, lab work & pertinent test results  History of Anesthesia Complications Negative for: history of anesthetic complications  Airway Mallampati: III  TM Distance: >3 FB Neck ROM: Full    Dental  (+) Edentulous Lower, Edentulous Upper   Pulmonary former smoker, PE (6 yrs ago, on Xarelto)   Pulmonary exam normal        Cardiovascular hypertension, Pt. on medications + CAD  Normal cardiovascular exam     Neuro/Psych negative neurological ROS  negative psych ROS   GI/Hepatic negative GI ROS, Neg liver ROS,   Endo/Other  negative endocrine ROS  Renal/GU negative Renal ROS  negative genitourinary   Musculoskeletal  (+) Arthritis ,   Abdominal   Peds  Hematology negative hematology ROS (+)   Anesthesia Other Findings Day of surgery medications reviewed with patient.  Reproductive/Obstetrics negative OB ROS                            Anesthesia Physical Anesthesia Plan  ASA: III  Anesthesia Plan: General   Post-op Pain Management:    Induction: Intravenous  PONV Risk Score and Plan: 2 and Treatment may vary due to age or medical condition, Ondansetron and Dexamethasone  Airway Management Planned: LMA  Additional Equipment: None  Intra-op Plan:   Post-operative Plan: Extubation in OR  Informed Consent: I have reviewed the patients History and Physical, chart, labs and discussed the procedure including the risks, benefits and alternatives for the proposed anesthesia with the patient or authorized representative who has indicated his/her understanding and acceptance.     Dental advisory given  Plan Discussed with: CRNA  Anesthesia Plan Comments: (See APP note by Durel Salts, FNP)       Anesthesia Quick Evaluation

## 2019-08-21 NOTE — Progress Notes (Signed)
Anesthesia Chart Review:   Case: [id number] Date/Time: 08/23/19 0820   Procedures:      CYSTOSCOPY WITH URETHRAL DILATATION (N/A ) - 1 HR     TRANSURETHRAL RESECTION OF BLADDER TUMOR (TURBT) (N/A )   Anesthesia type: General   Pre-op diagnosis: URETHRAL STRICTURE, HISTORY OF BLADDER CANCER   Location: [place] [facility]   Surgeons: [name], MD      DISCUSSION:  Pt is a 69 year old with hx PE (on l;ong term anticoagulation), CAD (minimal nonobstructive), HTN, multinodular thyroid (benign), bladder cancer, lupus\  - Saw cardiology in 2019 for chest pain. Coronary CT showed mild nonobstructive CAD. PRN f/u recommended.   Pt stopped xarelto 08/19/19   VS: BP 114/77   Pulse 86   Temp 36.6 C (Oral)   Resp 20   Ht 5\' 11"  (1.803 m)   Wt 118.8 kg   SpO2 97%   BMI 36.51 kg/m    PROVIDERS: - PCP is Deland Pretty, MD    LABS: Labs reviewed: Acceptable for surgery. (all labs ordered are listed, but only abnormal results are displayed)  Labs Reviewed  BASIC METABOLIC PANEL - Abnormal; Notable for the following components:      Result Value   Potassium 3.4 (*)    Glucose, Bld 110 (*)    All other components within normal limits  CBC     IMAGES:  CT coronary morphology 08/03/17:  1. Coronary calcium score of 18. This was 25 percentile for age and sex matched control. 2. Normal coronary origin with right dominance. 3. Minimal non-obstructive CAD. 4. Trivial calcifications in the aortic valve.   EKG 05/12/19: Sinus rhythm with 1st degree A-V block    Past Medical History:  Diagnosis Date  . Anticoagulant long-term use    xarelto   . History of chest pain    all documented in epic/   with cardiac evaluation's in 2008 and 2019  (10-07-2006 negative myoview for ischemia, ef 57% pt released prn by dr Stanford Breed, note in [place])  and (evaluated 06-16-2017 by Ermalinda Barrios PA, atypical cp CTA 08-03-2017 calcium score 18 w/ miminal nonob CAD w/ trivial AV  calcification, pt release prn)   . History of diverticulitis of colon 2016  . History of pulmonary embolus (PE) previously followed by hematology/ oncology--- dr Alen Blew;   now followed by pcp   07/ 2015  bilateral PE, provoked by long travel and smoking;  long term anticoagulant recommended  due to positive lupus anticoagulant  . Hypertension    followed by pcp  . Lower urinary tract symptoms (LUTS)   . Lupus anticoagulant positive    2015  . Mixed hyperlipidemia   . 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 OA (osteoarthritis)   . Recurrent malignant neoplasm of bladder [facility])    urologist-- dr Tresa Moore--- first dx 2012  s/p  TURBT  . S/P medial meniscus repair of left knee     Past Surgical History:  Procedure Laterality Date  . LACERATION REPAIR  yrs ago   repair complex laceration/ repair  left palm injury  . REVISION AMPUTATION OF FINGER  yrs ago   right index finger  . SEPTOPLASTY  yrs ago  .  THYROID LOBECTOMY Left 03-22-2007    dr Hassell Done @[facility]    AND ISTHMUSECTOMY  . TRANSURETHRAL RESECTION OF BLADDER TUMOR  10-21-2010  dr Jasmine December @[facility]   . TRANSURETHRAL RESECTION OF BLADDER TUMOR N/A 05/12/2019   Procedure: TRANSURETHRAL RESECTION OF BLADDER TUMOR (TURBT);  Surgeon: [name], MD;  Location: [facility];  Service: Urology;  Laterality: N/A;  . TRANSURETHRAL RESECTION OF PROSTATE N/A 05/12/2019   Procedure: TRANSURETHRAL RESECTION OF THE PROSTATE (TURP);  Surgeon: [name], MD;  Location: [facility];  Service: Urology;  Laterality: N/A;  75 MINS  . UMBILICAL HERNIA REPAIR  05-05-2010   DR Hassell Done @[facility]     MEDICATIONS: . finasteride (PROSCAR) 5 MG tablet  . Multiple Vitamin (MULTIVITAMIN) capsule  .  olmesartan-hydrochlorothiazide (BENICAR HCT) 40-25 MG tablet  . rosuvastatin (CRESTOR) 20 MG tablet  . senna-docusate (SENOKOT-S) 8.6-50 MG tablet  . sildenafil (VIAGRA) 100 MG tablet  . XARELTO 20 MG TABS tablet  . oxyCODONE-acetaminophen (PERCOCET) 5-325 MG tablet  . sulfamethoxazole-trimethoprim (BACTRIM DS) 800-160 MG tablet   No current facility-administered medications for this encounter.   Pt stopped xarelto 08/19/19   If no changes, I anticipate pt can proceed with surgery as scheduled.   Willeen Cass, FNP-BC [facility]/Anesthesiology Phone: [phone] 08/21/2019 10:09 AM

## 2019-08-23 ENCOUNTER — Encounter (HOSPITAL_COMMUNITY): Payer: Self-pay | Admitting: Urology

## 2019-08-23 ENCOUNTER — Ambulatory Visit (HOSPITAL_COMMUNITY): Payer: BC Managed Care – PPO

## 2019-08-23 ENCOUNTER — Ambulatory Visit (HOSPITAL_COMMUNITY)
Admission: RE | Admit: 2019-08-23 | Discharge: 2019-08-23 | Disposition: A | Discharge status: Home / Self Care | Payer: BC Managed Care – PPO | Attending: Urology | Admitting: Urology

## 2019-08-23 ENCOUNTER — Ambulatory Visit (HOSPITAL_COMMUNITY): Payer: BC Managed Care – PPO | Admitting: Emergency Medicine

## 2019-08-23 ENCOUNTER — Encounter (HOSPITAL_COMMUNITY)
Admission: RE | Disposition: A | Discharge status: Home / Self Care | Payer: Self-pay | Source: Home / Self Care | Attending: Urology

## 2019-08-23 ENCOUNTER — Ambulatory Visit (HOSPITAL_COMMUNITY): Payer: BC Managed Care – PPO | Admitting: Anesthesiology

## 2019-08-23 DIAGNOSIS — N35919 Unspecified urethral stricture, male, unspecified site: Secondary | ICD-10-CM | POA: Diagnosis not present

## 2019-08-23 DIAGNOSIS — I10 Essential (primary) hypertension: Secondary | ICD-10-CM | POA: Insufficient documentation

## 2019-08-23 DIAGNOSIS — N3592 Unspecified urethral stricture, female: Secondary | ICD-10-CM | POA: Diagnosis not present

## 2019-08-23 DIAGNOSIS — Z86711 Personal history of pulmonary embolism: Secondary | ICD-10-CM | POA: Diagnosis not present

## 2019-08-23 DIAGNOSIS — Z7901 Long term (current) use of anticoagulants: Secondary | ICD-10-CM | POA: Diagnosis not present

## 2019-08-23 DIAGNOSIS — I2510 Atherosclerotic heart disease of native coronary artery without angina pectoris: Secondary | ICD-10-CM | POA: Insufficient documentation

## 2019-08-23 DIAGNOSIS — Z86718 Personal history of other venous thrombosis and embolism: Secondary | ICD-10-CM | POA: Diagnosis not present

## 2019-08-23 DIAGNOSIS — Z87891 Personal history of nicotine dependence: Secondary | ICD-10-CM | POA: Insufficient documentation

## 2019-08-23 DIAGNOSIS — E782 Mixed hyperlipidemia: Secondary | ICD-10-CM | POA: Diagnosis not present

## 2019-08-23 DIAGNOSIS — M1990 Unspecified osteoarthritis, unspecified site: Secondary | ICD-10-CM | POA: Insufficient documentation

## 2019-08-23 DIAGNOSIS — Z8551 Personal history of malignant neoplasm of bladder: Secondary | ICD-10-CM | POA: Diagnosis not present

## 2019-08-23 DIAGNOSIS — Z79899 Other long term (current) drug therapy: Secondary | ICD-10-CM | POA: Insufficient documentation

## 2019-08-23 DIAGNOSIS — N401 Enlarged prostate with lower urinary tract symptoms: Secondary | ICD-10-CM | POA: Insufficient documentation

## 2019-08-23 HISTORY — PX: CYSTOSCOPY WITH URETHRAL DILATATION: SHX5125

## 2019-08-23 SURGERY — CYSTOSCOPY, WITH URETHRAL DILATION
Anesthesia: General

## 2019-08-23 MED ORDER — PROPOFOL 10 MG/ML IV BOLUS
INTRAVENOUS | Status: DC | PRN
  Administered 2019-08-23: 200 mg via INTRAVENOUS

## 2019-08-23 MED ORDER — FENTANYL CITRATE (PF) 100 MCG/2ML IJ SOLN
INTRAMUSCULAR | Status: DC | PRN
  Administered 2019-08-23: 25 ug via INTRAVENOUS
  Administered 2019-08-23: 75 ug via INTRAVENOUS

## 2019-08-23 MED ORDER — LIDOCAINE 2% (20 MG/ML) 5 ML SYRINGE
INTRAMUSCULAR | Status: DC | PRN
  Administered 2019-08-23: 100 mg via INTRAVENOUS

## 2019-08-23 MED ORDER — PHENYLEPHRINE HCL (PRESSORS) 10 MG/ML IV SOLN
INTRAVENOUS | Status: DC | PRN
  Administered 2019-08-23 (×3): 80 ug via INTRAVENOUS

## 2019-08-23 MED ORDER — ONDANSETRON HCL 4 MG/2ML IJ SOLN
INTRAMUSCULAR | Status: DC | PRN
  Administered 2019-08-23: 4 mg via INTRAVENOUS

## 2019-08-23 MED ORDER — 0.9 % SODIUM CHLORIDE (POUR BTL) OPTIME
TOPICAL | Status: DC | PRN
  Administered 2019-08-23: 1000 mL

## 2019-08-23 MED ORDER — LIDOCAINE 2% (20 MG/ML) 5 ML SYRINGE
INTRAMUSCULAR | Status: AC
  Filled 2019-08-23: qty 5

## 2019-08-23 MED ORDER — ONDANSETRON HCL 4 MG/2ML IJ SOLN
INTRAMUSCULAR | Status: AC
  Filled 2019-08-23: qty 2

## 2019-08-23 MED ORDER — DEXAMETHASONE SODIUM PHOSPHATE 10 MG/ML IJ SOLN
INTRAMUSCULAR | Status: AC
  Filled 2019-08-23: qty 1

## 2019-08-23 MED ORDER — OXYCODONE HCL 5 MG PO TABS: 5.0000 mg | ORAL_TABLET | Freq: Once | ORAL | Status: DC | PRN

## 2019-08-23 MED ORDER — STERILE WATER FOR IRRIGATION IR SOLN
Status: DC | PRN
  Administered 2019-08-23: 500 mL

## 2019-08-23 MED ORDER — GENTAMICIN SULFATE 40 MG/ML IJ SOLN
480.0000 mg | INTRAVENOUS | Status: AC
  Administered 2019-08-23: 480 mg via INTRAVENOUS
  Filled 2019-08-23: qty 12

## 2019-08-23 MED ORDER — SULFAMETHOXAZOLE-TRIMETHOPRIM 800-160 MG PO TABS
1.0000 | ORAL_TABLET | Freq: Two times a day (BID) | ORAL | 0 refills | Status: DC
Start: 2019-08-23 — End: 2021-03-14

## 2019-08-23 MED ORDER — ORAL CARE MOUTH RINSE: 15.0000 mL | Freq: Once | OROMUCOSAL | Status: AC

## 2019-08-23 MED ORDER — CHLORHEXIDINE GLUCONATE 0.12 % MT SOLN
15.0000 mL | Freq: Once | OROMUCOSAL | Status: AC
  Administered 2019-08-23: 15 mL via OROMUCOSAL

## 2019-08-23 MED ORDER — FENTANYL CITRATE (PF) 100 MCG/2ML IJ SOLN
INTRAMUSCULAR | Status: AC
  Filled 2019-08-23: qty 2

## 2019-08-23 MED ORDER — OXYCODONE HCL 5 MG/5ML PO SOLN: 5.0000 mg | Freq: Once | ORAL | Status: DC | PRN

## 2019-08-23 MED ORDER — FENTANYL CITRATE (PF) 100 MCG/2ML IJ SOLN: 25.0000 ug | INTRAMUSCULAR | Status: DC | PRN

## 2019-08-23 MED ORDER — DEXAMETHASONE SODIUM PHOSPHATE 10 MG/ML IJ SOLN
INTRAMUSCULAR | Status: DC | PRN
  Administered 2019-08-23: 5 mg via INTRAVENOUS

## 2019-08-23 MED ORDER — LACTATED RINGERS IV SOLN: INTRAVENOUS | Status: DC

## 2019-08-23 MED ORDER — CEFAZOLIN SODIUM-DEXTROSE 2-4 GM/100ML-% IV SOLN
INTRAVENOUS | Status: AC
  Filled 2019-08-23: qty 100

## 2019-08-23 MED ORDER — IOHEXOL 300 MG/ML  SOLN
INTRAMUSCULAR | Status: DC | PRN
  Administered 2019-08-23: 50 mL
  Administered 2019-08-23: 6 mL

## 2019-08-23 MED ORDER — PROPOFOL 10 MG/ML IV BOLUS
INTRAVENOUS | Status: AC
  Filled 2019-08-23: qty 20

## 2019-08-23 MED ORDER — ACETAMINOPHEN 500 MG PO TABS: 1000.0000 mg | ORAL_TABLET | Freq: Once | ORAL | Status: DC

## 2019-08-23 MED ORDER — PROMETHAZINE HCL 25 MG/ML IJ SOLN: 6.2500 mg | INTRAMUSCULAR | Status: DC | PRN

## 2019-08-23 MED ORDER — TRAMADOL HCL 50 MG PO TABS: 50.0000 mg | ORAL_TABLET | Freq: Four times a day (QID) | ORAL | 0 refills | Status: AC | PRN

## 2019-08-23 MED ORDER — SODIUM CHLORIDE 0.9 % IR SOLN
Status: DC | PRN
  Administered 2019-08-23: 3000 mL via INTRAVESICAL

## 2019-08-23 SURGICAL SUPPLY — 32 items
BAG DRN RND TRDRP ANRFLXCHMBR (UROLOGICAL SUPPLIES) ×1
BAG URINE DRAIN 2000ML AR STRL (UROLOGICAL SUPPLIES) ×2 IMPLANT
BAG URO CATCHER STRL LF (MISCELLANEOUS) ×2 IMPLANT
BALLN NEPHROSTOMY (BALLOONS)
BALLOON NEPHROSTOMY (BALLOONS) IMPLANT
CATH COUNCIL 22FR (CATHETERS) ×2 IMPLANT
CATH DILATION NEPHR BALL 15X10 (CATHETERS) ×2 IMPLANT
CATH FOLEY 2W COUNCIL 20FR 5CC (CATHETERS) IMPLANT
CATH INTERMIT  6FR 70CM (CATHETERS) ×2 IMPLANT
CATH ROBINSON RED A/P 14FR (CATHETERS) ×2 IMPLANT
CATH URET 5FR 28IN CONE TIP (BALLOONS)
CATH URET 5FR 70CM CONE TIP (BALLOONS) IMPLANT
CLOTH BEACON ORANGE TIMEOUT ST (SAFETY) ×2 IMPLANT
DEVICE INFLATION 20CC 30ATM (MISCELLANEOUS) ×2 IMPLANT
ELECT REM PT RETURN 15FT ADLT (MISCELLANEOUS) ×2 IMPLANT
EVACUATOR MICROVAS BLADDER (UROLOGICAL SUPPLIES) IMPLANT
GLOVE BIO SURGEON STRL SZ7.5 (GLOVE) ×2 IMPLANT
GLOVE BIOGEL M STRL SZ7.5 (GLOVE) ×2 IMPLANT
GOWN STRL REUS W/TWL LRG LVL3 (GOWN DISPOSABLE) ×2 IMPLANT
GUIDEWIRE ANG ZIPWIRE 038X150 (WIRE) IMPLANT
GUIDEWIRE STR DUAL SENSOR (WIRE) ×2 IMPLANT
KIT TURNOVER KIT A (KITS) IMPLANT
LOOP CUT BIPOLAR 24F LRG (ELECTROSURGICAL) IMPLANT
MANIFOLD NEPTUNE II (INSTRUMENTS) ×2 IMPLANT
NS IRRIG 1000ML POUR BTL (IV SOLUTION) IMPLANT
PACK CYSTO (CUSTOM PROCEDURE TRAY) ×2 IMPLANT
PENCIL SMOKE EVACUATOR (MISCELLANEOUS) IMPLANT
SYR TOOMEY IRRIG 70ML (MISCELLANEOUS)
SYRINGE TOOMEY IRRIG 70ML (MISCELLANEOUS) IMPLANT
TUBING CONNECTING 10 (TUBING) ×2 IMPLANT
TUBING UROLOGY SET (TUBING) ×2 IMPLANT
WATER STERILE IRR 3000ML UROMA (IV SOLUTION) ×2 IMPLANT

## 2019-08-23 NOTE — Anesthesia Postprocedure Evaluation (Signed)
Anesthesia Post Note  Patient: Gerald Morales  Procedure(s) Performed: CYSTOSCOPY WITH URETHRAL DILATATION (N/A )     Patient location during evaluation: PACU Anesthesia Type: General Level of consciousness: awake and alert and oriented Pain management: pain level controlled Vital Signs Assessment: post-procedure vital signs reviewed and stable Respiratory status: spontaneous breathing, nonlabored ventilation and respiratory function stable Cardiovascular status: blood pressure returned to baseline Postop Assessment: no apparent nausea or vomiting Anesthetic complications: no   No complications documented.  Last Vitals:  Vitals:   08/23/19 1020 08/23/19 1108  BP: (!) 148/94 (!) 147/85  Pulse: 69 65  Resp: 16 18  Temp: 36.5 C   SpO2: 96% 96%    Last Pain:  Vitals:   08/23/19 1108  TempSrc:   PainSc: 0-No pain                 Brennan Bailey

## 2019-08-23 NOTE — Transfer of Care (Signed)
Immediate Anesthesia Transfer of Care Note  Patient: Gerald Morales  Procedure(s) Performed: CYSTOSCOPY WITH URETHRAL DILATATION (N/A )  Patient Location: PACU  Anesthesia Type:General  Level of Consciousness: awake, alert  and oriented  Airway & Oxygen Therapy: Patient Spontanous Breathing and Patient connected to face mask oxygen  Post-op Assessment: Report given to RN and Post -op Vital signs reviewed and stable  Post vital signs: Reviewed and stable  Last Vitals:  Vitals Value Taken Time  BP 156/89 08/23/19 0941  Temp    Pulse 74 08/23/19 0942  Resp 15 08/23/19 0942  SpO2 99 % 08/23/19 0942  Vitals shown include unvalidated device data.  Last Pain:  Vitals:   08/23/19 0656  TempSrc: Oral  PainSc: 0-No pain         Complications: No complications documented.

## 2019-08-23 NOTE — H&P (Signed)
Gerald Morales is an 69 y.o. adult.    Chief Complaint: Pre-Op Cysto / Urethral Dilation, Possible TURBT  HPI:   1 - Bladder Cancer - TaG1 by TURBT 09/2010.   Recent Course:  04/2019 - T1G3 bladder neck / prostate  07/2019 - urethroscopy - distal stricture, unable to veiw proximal.   2 - Lower Urinary Tract Symptoms / Nocturia - Progressive bother from irritative and obstructive symptoms with nocuturia x 4-5. DRE 2021 60gm. He has significant LE edema and drinkns fluids until bedtime. UA 2021 w/o infection. PVR 2021 " 277 mL" (modest elevation). TURP 04/2019 at time of TURBT.    PMH sig for DVT/PE/Xarelto (follows Gerald Morales), orhto surgeries, umbil hernia repair. His PCP is Gerald Pretty MD.    Today " Gerald Morales " is seen for operative cysto / dilation for stricture. No interval fevers. C19 screen negative, Cr 0.96, Hgb 16, most recent UA without overt infectious parameters. He has held xarelto as incructed.   Past Medical History:  Diagnosis Date  . Anticoagulant long-term use    xarelto   . Coronary artery disease    mild nonobstructive by coronary CTA 2019  . History of chest pain    all documented in epic/   with cardiac evaluation's in 2008 and 2019  (10-07-2006 negative myoview for ischemia, ef 57% pt released prn by Gerald Morales, note in [place])  and (evaluated 06-16-2017 by Gerald Barrios PA, atypical cp CTA 08-03-2017 calcium score 18 w/ miminal nonob CAD w/ trivial AV calcification, pt release prn)   . History of diverticulitis of colon 2016  . History of pulmonary embolus (PE) previously followed by hematology/ oncology--- Gerald Gerald Morales;   now followed by pcp   07/ 2015  bilateral PE, provoked by long travel and smoking;  long term anticoagulant recommended  due to positive lupus anticoagulant  . Hypertension    followed by pcp  . Lower urinary tract symptoms (LUTS)   . Lupus anticoagulant positive    2015  . Mixed hyperlipidemia   . 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 OA (osteoarthritis)   . Recurrent malignant neoplasm of bladder [facility])    urologist-- Gerald Gerald Morales--- first dx 2012  s/p  TURBT  . S/P medial meniscus repair of left knee     Past Surgical History:  Procedure Laterality Date  . LACERATION REPAIR  yrs ago   repair complex laceration/ repair  left palm injury  . REVISION AMPUTATION OF FINGER  yrs ago   right index finger  . SEPTOPLASTY  yrs ago  . THYROID LOBECTOMY Left 03-22-2007    Gerald Gerald Morales @[facility]    AND ISTHMUSECTOMY  . TRANSURETHRAL RESECTION OF BLADDER TUMOR  10-21-2010  Gerald Gerald Morales @[facility]   . TRANSURETHRAL RESECTION OF BLADDER TUMOR N/A 05/12/2019   Procedure: TRANSURETHRAL RESECTION OF BLADDER TUMOR (TURBT);  Surgeon: Gerald Frock, MD;  Location[facility];  Service: Urology;  Laterality: N/A;  . TRANSURETHRAL RESECTION OF PROSTATE N/A 05/12/2019   Procedure: TRANSURETHRAL RESECTION OF THE PROSTATE (TURP);  Surgeon: Gerald Frock, MD;  Location: [facility];  Service: Urology;  Laterality: N/A;  75 MINS  . UMBILICAL HERNIA REPAIR  05-05-2010   Gerald Gerald Morales @[facility]   Family History  Problem Relation Age of Onset  . Hypertension Mother   . Diabetes Mother   . Hypertension Father    Social History:  reports that she quit smoking about 6 years ago. Her smoking use included cigarettes, pipe, and cigars. She has a 70.00 pack-year smoking history. She quit smokeless tobacco use about 6 years ago.  Her smokeless tobacco use included chew. She reports current alcohol use of about 14.0 standard drinks of alcohol per week. She reports that she does not use drugs.  Allergies:  Allergies  Allergen Reactions  . Lipitor [Atorvastatin] Anxiety and Other (See Comments)    Patient stated,"anxiety, increased  heart rate, nervousness."  . Vytorin [Ezetimibe-Simvastatin] Anxiety and Other (See Comments)    Patient stated,"anxiety, increased heart rate, nervousness."    No medications prior to admission.    No results found for this or any previous visit (from the past 48 hour(s)). No results found.  Review of Systems  Constitutional: Negative for chills and fever.  Genitourinary: Positive for urgency.  All other systems reviewed and are negative.   There were no vitals taken for this visit. Physical Exam HENT:     Head: Normocephalic.  Eyes:     Pupils: Pupils are equal, round, and reactive to light.  Pulmonary:     Effort: Pulmonary effort is normal.  Abdominal:     General: Abdomen is flat.  Genitourinary:    Comments: No CVAT at present.  Musculoskeletal:        General: Normal range of motion.     Cervical back: Normal range of motion.  Skin:    General: Skin is warm.  Neurological:     General: No focal deficit present.     Mental Status: She is alert.  Psychiatric:        Mood and Affect: Mood normal.      Assessment/Plan  Proceed as planned with cysto / urethral dilation, possible TURBT if any recurrence.   Gerald Frock, MD 08/23/2019, 5:39 AM

## 2019-08-23 NOTE — Op Note (Signed)
NAME: Gerald Morales, Gerald Morales MEDICAL RECORD XN:[id number] ACCOUNT [id number] DATE OF BIRTH:1950/07/06 FACILITY: [facility] LOCATION: [facility] PHYSICIAN:Daved Mcfann Tresa Moore, MD  OPERATIVE REPORT  DATE OF PROCEDURE:  08/23/2019  PREOPERATIVE DIAGNOSES: 1.  History of bladder cancer. 2.  Urethral stricture.  POSTOPERATIVE DIAGNOSES:   1.  History of bladder cancer. 2.  Urethral stricture.  PROCEDURES: 1.  Cystoscopy with urethral dilation. 2.  Retrograde urethrogram. 3.  Cystoscopy.  ESTIMATED BLOOD LOSS:  Nil.  MEDICATIONS:  None.  SPECIMENS:  None.  FINDINGS: 1.  High-grade distal penile stricture approximately 8-French predilation, 30-French post-dilation. 2.  Unremarkable urethra other than distal stricture. 3.  No evidence of bladder tumor recurrence.  INDICATIONS:  The patient is a very pleasant 69 year old man with history of prostatic hypertrophy and bladder cancer.  He has been on surveillance for this and been very compliant.  He presented for surveillance cystoscopy to our office recently, but  however, was noted to have a high-grade distal stricture which precluded the office procedure.  Options of office dilation versus operative dilation were discussed and he wished to proceed with the latter under anesthetic.  Informed consent was obtained  and placed in medical record.  DESCRIPTION OF PROCEDURE:  Patient being identified as patient, procedure being cysto, urethral dilation, possible bladder tumor resection was confirmed.  Procedure timeout was performed.  Intravenous antibiotics administered.  General anesthesia  induced.  The patient was placed into a low lithotomy position, sterile field was created, prepped and draped base of patient's penis, perineum and proximal thighs using iodine.  Cystoscopy, ureteroscopy was attempted; however, he was again noted to have  a high grade very distal stricture not even accommodating the cystoscope into the fossa.  As such a 0.038 ZIPwire  was navigated under fluoroscopic guidance to the level of the urinary bladder.  This confirmed by placing contrast the open-ended catheter  into the bladder and using fluoroscopic guidance a 30-French balloon dilation catheter was advanced across the level of the distal stricture and inflated to a pressure of 20 atmospheres, held for 90 seconds.  This was then advanced under fluoroscopic  guidance more proximally across the more proximal urethra to the level of the urinary bladder.  Again inflated to a pressure of 20 atmospheres, held for 90 seconds and then released.  Next, cystourethroscopy was then successfully performed using a  21-French rigid cystoscope.  This revealed only a relatively short segment distal stricture that had been successfully dilated.  There were no additional strictures proximal to this.  There was evidence of prior TURP defect with wide open prostatic  fossa.  Inspection of the urinary bladder revealed no diverticula, calcifications, lesions.  Ureteral orifices were singleton.  The cystoscope was removed and then a new 22-French Councill catheter was placed over the sensor working wire using  fluoroscopic guidance.  20 mL of water in the balloon and connected to  straight drain.   The patient tolerated the procedure well.  No immediate complications.  The patient taken to postanesthesia care in stable condition.  Plan for discharge home and voiding trial in several days.  CN/NUANCE  D:08/23/2019 T:08/23/2019 JOB:[id number]

## 2019-08-23 NOTE — Brief Op Note (Signed)
08/23/2019  9:41 AM  PATIENT:  Gerald Morales  69 y.o. adult  PRE-OPERATIVE DIAGNOSIS:  URETHRAL STRICTURE, HISTORY OF BLADDER CANCER  POST-OPERATIVE DIAGNOSIS:  URETHRAL STRICTURE, HISTORY OF BLADDER CANCER  PROCEDURE:  Procedure(s) with comments: CYSTOSCOPY WITH URETHRAL DILATATION (N/A) - 1 HR  SURGEON:  Surgeon(s) and Role:    * [name], MD - Primary  PHYSICIAN ASSISTANT:   ASSISTANTS: none   ANESTHESIA:   general  EBL:  minimal   BLOOD ADMINISTERED:none  DRAINS: 71F foley to gravity   LOCAL MEDICATIONS USED:  NONE  SPECIMEN:  No Specimen  DISPOSITION OF SPECIMEN:  N/A  COUNTS:  YES  TOURNIQUET:  * No tourniquets in log *  DICTATION: .Other Dictation: Dictation Number [id number]  PLAN OF CARE: Discharge to home after PACU  PATIENT DISPOSITION:  PACU - hemodynamically stable.   Delay start of Pharmacological VTE agent (>24hrs) due to surgical blood loss or risk of bleeding: yes

## 2019-08-23 NOTE — Discharge Instructions (Signed)
1 - You may have urinary urgency (bladder spasms) and bloody urine on / off with catheter in place. This is normal.  2 - Resume blood thinners on Friday.   3 - Call MD or go to ER for fever >102, severe pain / nausea / vomiting not relieved by medications, or acute change in medical status

## 2019-08-23 NOTE — Anesthesia Procedure Notes (Signed)
Procedure Name: LMA Insertion Date/Time: 08/23/2019 9:09 AM Performed by: [place] Phyllip Claw C, CRNA Pre-anesthesia Checklist: Patient identified, Emergency Drugs available, Suction available and Patient being monitored Patient Re-evaluated:Patient Re-evaluated prior to induction Oxygen Delivery Method: Circle system utilized Preoxygenation: Pre-oxygenation with 100% oxygen Induction Type: IV induction Ventilation: Mask ventilation without difficulty LMA: LMA inserted LMA Size: 5.0 Number of attempts: 1 Airway Equipment and Method: Bite block Placement Confirmation: positive ETCO2 Tube secured with: Tape Dental Injury: Teeth and Oropharynx as per pre-operative assessment

## 2019-08-24 ENCOUNTER — Encounter (HOSPITAL_COMMUNITY): Payer: Self-pay | Admitting: Urology

## 2019-08-29 DIAGNOSIS — C678 Malignant neoplasm of overlapping sites of bladder: Secondary | ICD-10-CM | POA: Diagnosis not present

## 2019-08-29 DIAGNOSIS — N35013 Post-traumatic anterior urethral stricture: Secondary | ICD-10-CM | POA: Diagnosis not present

## 2019-08-29 DIAGNOSIS — R310 Gross hematuria: Secondary | ICD-10-CM | POA: Diagnosis not present

## 2019-12-05 DIAGNOSIS — N35013 Post-traumatic anterior urethral stricture: Secondary | ICD-10-CM | POA: Diagnosis not present

## 2019-12-05 DIAGNOSIS — R3915 Urgency of urination: Secondary | ICD-10-CM | POA: Diagnosis not present

## 2019-12-05 DIAGNOSIS — C678 Malignant neoplasm of overlapping sites of bladder: Secondary | ICD-10-CM | POA: Diagnosis not present

## 2020-01-16 DIAGNOSIS — Z0000 Encounter for general adult medical examination without abnormal findings: Secondary | ICD-10-CM | POA: Diagnosis not present

## 2020-01-16 DIAGNOSIS — I10 Essential (primary) hypertension: Secondary | ICD-10-CM | POA: Diagnosis not present

## 2020-01-16 DIAGNOSIS — Z125 Encounter for screening for malignant neoplasm of prostate: Secondary | ICD-10-CM | POA: Diagnosis not present

## 2020-03-13 ENCOUNTER — Ambulatory Visit (INDEPENDENT_AMBULATORY_CARE_PROVIDER_SITE_OTHER): Payer: BC Managed Care – PPO | Admitting: Orthopaedic Surgery

## 2020-03-13 ENCOUNTER — Ambulatory Visit: Payer: Self-pay

## 2020-03-13 ENCOUNTER — Other Ambulatory Visit: Payer: Self-pay

## 2020-03-13 DIAGNOSIS — M1712 Unilateral primary osteoarthritis, left knee: Secondary | ICD-10-CM

## 2020-03-13 MED ORDER — METHYLPREDNISOLONE ACETATE 40 MG/ML IJ SUSP
40.0000 mg | INTRAMUSCULAR | Status: AC | PRN
  Administered 2020-03-13: 40 mg via INTRA_ARTICULAR

## 2020-03-13 MED ORDER — LIDOCAINE HCL 1 % IJ SOLN
2.0000 mL | INTRAMUSCULAR | Status: AC | PRN
  Administered 2020-03-13: 2 mL

## 2020-03-13 MED ORDER — BUPIVACAINE HCL 0.25 % IJ SOLN
2.0000 mL | INTRAMUSCULAR | Status: AC | PRN
  Administered 2020-03-13: 2 mL via INTRA_ARTICULAR

## 2020-03-13 NOTE — Progress Notes (Signed)
Office Visit Note   Patient: Gerald Morales           Date of Birth: [birth year]           MRN: [id number] Visit Date: 03/13/2020              Requested by: Deland Pretty, MD [address] [place],  [place] [address] PCP: Deland Pretty, MD   Assessment & Plan: Visit Diagnoses:  1. Unilateral primary osteoarthritis, left knee     Plan: Impression is left knee advanced degenerative joint disease.  Today, we discussed cortisone injection in the possibility of total knee replacement in the future.  He would like to proceed with cortisone injection today.  I aspirated approximately 10 cc of serosanguineous fluid from the left knee and then injected it with cortisone.  He will follow-up with [place] as needed.  Follow-Up Instructions: Return if symptoms worsen or fail to improve.   Orders:  Orders Placed This Encounter  Procedures  . Large Joint Inj  . XR KNEE 3 VIEW LEFT   No orders of the defined types were placed in this encounter.     Procedures: Large Joint Inj: L knee on 03/13/2020 9:29 AM Indications: pain Details: 22 G needle, anterolateral approach Medications: 2 mL lidocaine 1 %; 2 mL bupivacaine 0.25 %; 40 mg methylPREDNISolone acetate 40 MG/ML      Clinical Data: No additional findings.   Subjective: Chief Complaint  Patient presents with  . Left Knee - Pain    HPI patient is a pleasant 70 year old gentleman who comes in today with left knee instability and mild pain.  He has been dealing with this for several years.  He notes that over the past year or so after taking the first few steps from standing he feels as though his knee is unable to support his body.  He has minimal pain which is really only aggravated with stairs.  He does not take any over-the-counter pain medication for this.  He denies any previous cortisone injection, however we do have record of previous knee aspiration and injection 2 years ago.  Review of Systems as detailed in  HPI.  All others reviewed and are negative.   Objective: Vital Signs: There were no vitals taken for this visit.  Physical Exam well-developed well-nourished gentleman in no acute distress.  Alert oriented x3.  Ortho Exam left knee exam shows a small effusion.  Range of motion 0 to 100 degrees.  No joint line tenderness.  Moderate patellofemoral crepitus.  He is neurovascular intact distally.  Specialty Comments:  No specialty comments available.  Imaging: XR KNEE 3 VIEW LEFT  Result Date: 03/13/2020 Advanced degenerative changes medial and patellofemoral compartments    PMFS History: Patient Active Problem List   Diagnosis Date Noted  . Prostatic hyperplasia 05/12/2019  . Unilateral primary osteoarthritis, left knee 04/01/2018  . Chest pain 06/15/2017  . Hx of bladder cancer 06/02/2014  . Hx pulmonary embolism 06/02/2014  . Diverticulitis of colon 06/02/2014  . Erectile dysfunction 06/02/2014  . Essential hypertension 06/02/2014  . Hyperlipidemia 06/02/2014   Past Medical History:  Diagnosis Date  . Anticoagulant long-term use    xarelto   . Coronary artery disease    mild nonobstructive by coronary CTA 2019  . History of chest pain    all documented in epic/   with cardiac evaluation's in 2008 and 2019  (10-07-2006 negative myoview for ischemia, ef 57% pt released prn by dr Stanford Breed,  note in [facility])  and (evaluated 06-16-2017 by Ermalinda Barrios PA, atypical cp CTA 08-03-2017 calcium score 18 w/ miminal nonob CAD w/ trivial AV calcification, pt release prn)   . History of diverticulitis of colon 2016  . History of pulmonary embolus (PE) previously followed by hematology/ oncology--- dr Alen Blew;   now followed by pcp   07/ 2015  bilateral PE, provoked by long travel and smoking;  long term anticoagulant recommended  due to positive lupus anticoagulant  . Hypertension    followed by pcp  . Lower urinary tract symptoms (LUTS)   . Lupus anticoagulant positive    2015  .  Mixed hyperlipidemia   . 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 OA (osteoarthritis)   . Recurrent malignant neoplasm of bladder [facility])    urologist-- dr Tresa Moore--- first dx 2012  s/p  TURBT  . S/P medial meniscus repair of left knee     Family History  Problem Relation Age of Onset  . Hypertension Mother   . Diabetes Mother   . Hypertension Father     Past Surgical History:  Procedure Laterality Date  . CYSTOSCOPY WITH URETHRAL DILATATION N/A 08/23/2019   Procedure: CYSTOSCOPY WITH URETHRAL DILATATION;  Surgeon: [name], MD;  Location: [facility];  Service: Urology;  Laterality: N/A;  1 HR  . LACERATION REPAIR  yrs ago   repair complex laceration/ repair  left palm injury  . REVISION AMPUTATION OF FINGER  yrs ago   right index finger  . SEPTOPLASTY  yrs ago  . THYROID LOBECTOMY Left 03-22-2007    dr Hassell Done @[facility]    AND ISTHMUSECTOMY  . TRANSURETHRAL RESECTION OF BLADDER TUMOR  10-21-2010  dr Jasmine December @[facility]   . TRANSURETHRAL RESECTION OF BLADDER TUMOR N/A 05/12/2019   Procedure: TRANSURETHRAL RESECTION OF BLADDER TUMOR (TURBT);  Surgeon: [name], MD;  Location: [facility];  Service: Urology;  Laterality: N/A;  . TRANSURETHRAL RESECTION OF PROSTATE N/A 05/12/2019   Procedure: TRANSURETHRAL RESECTION OF THE PROSTATE (TURP);  Surgeon: [name], MD;  Location[facility];  Service: Urology;  Laterality: N/A;  75 MINS  . UMBILICAL HERNIA REPAIR  05-05-2010   DR Hassell Done @[facility]    Social History   Occupational History  . Not on file  Tobacco Use  . Smoking status: Former Smoker    Packs/day: 2.00    Years: 35.00    Pack years: 70.00    Types: Cigarettes, Pipe, Cigars    Quit date: 05/07/2013    Years since quitting:  6.8  . Smokeless tobacco: Former Systems developer    Types: Chew    Quit date: 05/07/2013  . Tobacco comment: 05-08-2019  per pt quit smoking cig. approx. 2015/  quit cigars 2014;  quit pipe 2005   Vaping Use  . Vaping Use: Never used  Substance and Sexual Activity  . Alcohol use: Yes    Alcohol/week: 14.0 standard drinks    Types: 14 Cans of beer per week    Comment: 2-4 beers daily  . Drug use: Never  . Sexual activity: Not Currently

## 2020-04-09 DIAGNOSIS — D3500 Benign neoplasm of unspecified adrenal gland: Secondary | ICD-10-CM | POA: Diagnosis not present

## 2020-04-09 DIAGNOSIS — C678 Malignant neoplasm of overlapping sites of bladder: Secondary | ICD-10-CM | POA: Diagnosis not present

## 2020-04-09 DIAGNOSIS — N35013 Post-traumatic anterior urethral stricture: Secondary | ICD-10-CM | POA: Diagnosis not present

## 2020-04-09 DIAGNOSIS — R3915 Urgency of urination: Secondary | ICD-10-CM | POA: Diagnosis not present

## 2020-07-08 DIAGNOSIS — C678 Malignant neoplasm of overlapping sites of bladder: Secondary | ICD-10-CM | POA: Diagnosis not present

## 2020-07-08 DIAGNOSIS — N35013 Post-traumatic anterior urethral stricture: Secondary | ICD-10-CM | POA: Diagnosis not present

## 2020-07-26 DIAGNOSIS — I10 Essential (primary) hypertension: Secondary | ICD-10-CM | POA: Diagnosis not present

## 2020-07-26 DIAGNOSIS — E7800 Pure hypercholesterolemia, unspecified: Secondary | ICD-10-CM | POA: Diagnosis not present

## 2020-08-05 DIAGNOSIS — E7800 Pure hypercholesterolemia, unspecified: Secondary | ICD-10-CM | POA: Diagnosis not present

## 2020-08-05 DIAGNOSIS — R7303 Prediabetes: Secondary | ICD-10-CM | POA: Diagnosis not present

## 2020-08-05 DIAGNOSIS — I10 Essential (primary) hypertension: Secondary | ICD-10-CM | POA: Diagnosis not present

## 2020-10-03 DIAGNOSIS — S99922A Unspecified injury of left foot, initial encounter: Secondary | ICD-10-CM | POA: Diagnosis not present

## 2020-10-03 DIAGNOSIS — M7989 Other specified soft tissue disorders: Secondary | ICD-10-CM | POA: Diagnosis not present

## 2020-10-30 DIAGNOSIS — R1030 Lower abdominal pain, unspecified: Secondary | ICD-10-CM | POA: Diagnosis not present

## 2020-10-30 DIAGNOSIS — R197 Diarrhea, unspecified: Secondary | ICD-10-CM | POA: Diagnosis not present

## 2020-10-31 DIAGNOSIS — R17 Unspecified jaundice: Secondary | ICD-10-CM | POA: Diagnosis not present

## 2020-10-31 DIAGNOSIS — D72829 Elevated white blood cell count, unspecified: Secondary | ICD-10-CM | POA: Diagnosis not present

## 2020-11-01 DIAGNOSIS — D72829 Elevated white blood cell count, unspecified: Secondary | ICD-10-CM | POA: Diagnosis not present

## 2020-11-01 DIAGNOSIS — R1030 Lower abdominal pain, unspecified: Secondary | ICD-10-CM | POA: Diagnosis not present

## 2020-11-01 DIAGNOSIS — R197 Diarrhea, unspecified: Secondary | ICD-10-CM | POA: Diagnosis not present

## 2020-11-04 ENCOUNTER — Other Ambulatory Visit: Payer: Self-pay | Admitting: Internal Medicine

## 2020-11-04 DIAGNOSIS — R197 Diarrhea, unspecified: Secondary | ICD-10-CM

## 2020-11-04 DIAGNOSIS — R1030 Lower abdominal pain, unspecified: Secondary | ICD-10-CM

## 2020-11-04 DIAGNOSIS — R17 Unspecified jaundice: Secondary | ICD-10-CM

## 2020-11-05 ENCOUNTER — Ambulatory Visit
Admission: RE | Admit: 2020-11-05 | Discharge: 2020-11-05 | Disposition: A | Discharge status: Home / Self Care | Payer: Medicare HMO | Source: Ambulatory Visit | Attending: Internal Medicine | Admitting: Internal Medicine

## 2020-11-05 DIAGNOSIS — R109 Unspecified abdominal pain: Secondary | ICD-10-CM | POA: Diagnosis not present

## 2020-11-05 DIAGNOSIS — R1030 Lower abdominal pain, unspecified: Secondary | ICD-10-CM

## 2020-11-05 DIAGNOSIS — R197 Diarrhea, unspecified: Secondary | ICD-10-CM

## 2020-11-05 DIAGNOSIS — K760 Fatty (change of) liver, not elsewhere classified: Secondary | ICD-10-CM | POA: Diagnosis not present

## 2020-11-05 DIAGNOSIS — R17 Unspecified jaundice: Secondary | ICD-10-CM

## 2020-11-07 DIAGNOSIS — D72829 Elevated white blood cell count, unspecified: Secondary | ICD-10-CM | POA: Diagnosis not present

## 2020-11-07 DIAGNOSIS — R17 Unspecified jaundice: Secondary | ICD-10-CM | POA: Diagnosis not present

## 2020-11-07 DIAGNOSIS — R935 Abnormal findings on diagnostic imaging of other abdominal regions, including retroperitoneum: Secondary | ICD-10-CM | POA: Diagnosis not present

## 2020-11-07 DIAGNOSIS — E876 Hypokalemia: Secondary | ICD-10-CM | POA: Diagnosis not present

## 2020-12-03 DIAGNOSIS — N35013 Post-traumatic anterior urethral stricture: Secondary | ICD-10-CM | POA: Diagnosis not present

## 2020-12-03 DIAGNOSIS — C678 Malignant neoplasm of overlapping sites of bladder: Secondary | ICD-10-CM | POA: Diagnosis not present

## 2020-12-03 DIAGNOSIS — R3915 Urgency of urination: Secondary | ICD-10-CM | POA: Diagnosis not present

## 2020-12-05 DIAGNOSIS — Z8719 Personal history of other diseases of the digestive system: Secondary | ICD-10-CM | POA: Diagnosis not present

## 2020-12-05 DIAGNOSIS — R9389 Abnormal findings on diagnostic imaging of other specified body structures: Secondary | ICD-10-CM | POA: Diagnosis not present

## 2020-12-06 DIAGNOSIS — Z23 Encounter for immunization: Secondary | ICD-10-CM | POA: Diagnosis not present

## 2020-12-31 DIAGNOSIS — Z87891 Personal history of nicotine dependence: Secondary | ICD-10-CM | POA: Diagnosis not present

## 2020-12-31 DIAGNOSIS — E785 Hyperlipidemia, unspecified: Secondary | ICD-10-CM | POA: Diagnosis not present

## 2020-12-31 DIAGNOSIS — Z8551 Personal history of malignant neoplasm of bladder: Secondary | ICD-10-CM | POA: Diagnosis not present

## 2020-12-31 DIAGNOSIS — I10 Essential (primary) hypertension: Secondary | ICD-10-CM | POA: Diagnosis not present

## 2020-12-31 DIAGNOSIS — N400 Enlarged prostate without lower urinary tract symptoms: Secondary | ICD-10-CM | POA: Diagnosis not present

## 2020-12-31 DIAGNOSIS — Z7901 Long term (current) use of anticoagulants: Secondary | ICD-10-CM | POA: Diagnosis not present

## 2020-12-31 DIAGNOSIS — Z86711 Personal history of pulmonary embolism: Secondary | ICD-10-CM | POA: Diagnosis not present

## 2021-02-12 DIAGNOSIS — E7800 Pure hypercholesterolemia, unspecified: Secondary | ICD-10-CM | POA: Diagnosis not present

## 2021-02-12 DIAGNOSIS — Z125 Encounter for screening for malignant neoplasm of prostate: Secondary | ICD-10-CM | POA: Diagnosis not present

## 2021-02-12 DIAGNOSIS — I10 Essential (primary) hypertension: Secondary | ICD-10-CM | POA: Diagnosis not present

## 2021-02-19 DIAGNOSIS — E782 Mixed hyperlipidemia: Secondary | ICD-10-CM | POA: Diagnosis not present

## 2021-02-19 DIAGNOSIS — N50812 Left testicular pain: Secondary | ICD-10-CM | POA: Diagnosis not present

## 2021-02-19 DIAGNOSIS — R053 Chronic cough: Secondary | ICD-10-CM | POA: Diagnosis not present

## 2021-02-19 DIAGNOSIS — I2510 Atherosclerotic heart disease of native coronary artery without angina pectoris: Secondary | ICD-10-CM | POA: Diagnosis not present

## 2021-02-19 DIAGNOSIS — R7303 Prediabetes: Secondary | ICD-10-CM | POA: Diagnosis not present

## 2021-02-19 DIAGNOSIS — Z86711 Personal history of pulmonary embolism: Secondary | ICD-10-CM | POA: Diagnosis not present

## 2021-02-19 DIAGNOSIS — N529 Male erectile dysfunction, unspecified: Secondary | ICD-10-CM | POA: Diagnosis not present

## 2021-02-19 DIAGNOSIS — I10 Essential (primary) hypertension: Secondary | ICD-10-CM | POA: Diagnosis not present

## 2021-02-19 DIAGNOSIS — Z0000 Encounter for general adult medical examination without abnormal findings: Secondary | ICD-10-CM | POA: Diagnosis not present

## 2021-02-19 DIAGNOSIS — I728 Aneurysm of other specified arteries: Secondary | ICD-10-CM | POA: Diagnosis not present

## 2021-02-20 ENCOUNTER — Other Ambulatory Visit: Payer: Self-pay | Admitting: Internal Medicine

## 2021-02-20 DIAGNOSIS — R8271 Bacteriuria: Secondary | ICD-10-CM | POA: Diagnosis not present

## 2021-02-20 DIAGNOSIS — E782 Mixed hyperlipidemia: Secondary | ICD-10-CM

## 2021-02-20 DIAGNOSIS — N453 Epididymo-orchitis: Secondary | ICD-10-CM | POA: Diagnosis not present

## 2021-02-26 DIAGNOSIS — N433 Hydrocele, unspecified: Secondary | ICD-10-CM | POA: Diagnosis not present

## 2021-02-26 DIAGNOSIS — N453 Epididymo-orchitis: Secondary | ICD-10-CM | POA: Diagnosis not present

## 2021-02-26 DIAGNOSIS — K5730 Diverticulosis of large intestine without perforation or abscess without bleeding: Secondary | ICD-10-CM | POA: Diagnosis not present

## 2021-02-26 DIAGNOSIS — N5089 Other specified disorders of the male genital organs: Secondary | ICD-10-CM | POA: Diagnosis not present

## 2021-02-26 DIAGNOSIS — Z8551 Personal history of malignant neoplasm of bladder: Secondary | ICD-10-CM | POA: Diagnosis not present

## 2021-03-04 DIAGNOSIS — R3915 Urgency of urination: Secondary | ICD-10-CM | POA: Diagnosis not present

## 2021-03-04 DIAGNOSIS — C678 Malignant neoplasm of overlapping sites of bladder: Secondary | ICD-10-CM | POA: Diagnosis not present

## 2021-03-04 DIAGNOSIS — N35013 Post-traumatic anterior urethral stricture: Secondary | ICD-10-CM | POA: Diagnosis not present

## 2021-03-12 NOTE — Progress Notes (Signed)
Office Note     CC: Celiac artery dilation Requesting Provider:  Deland Pretty, MD  HPI: Gerald Morales is a 71 y.o. 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  Originally from [place], and alumni of [place], [place] now lives in [place]. On exam today, Gerald Morales was doing well.  He had no complaints.  The initial CT was done due to severe abdominal pain and diarrhea.  This has resolved.  He has normal bowel movements.  No food fear, no postprandial pain. Denies symptoms of claudication, rest pain, tissue loss in the feet.  The pt is  on a statin for cholesterol management.  The pt is - on a daily aspirin.   Other AC:  Xarelto The pt is  on medication for hypertension.   The pt is not diabetic.  Tobacco hx:  former  Past Medical History:  Diagnosis Date   Anticoagulant long-term use    xarelto    Coronary artery disease    mild nonobstructive by coronary CTA 2019   History of chest pain    all documented in epic/   with cardiac evaluation's in 2008 and 2019  (10-07-2006 negative myoview for ischemia, ef 57% pt released prn by dr Stanford Breed, note in [place])  and (evaluated 06-16-2017 by Ermalinda Barrios PA, atypical cp CTA 08-03-2017 calcium score 18 w/ miminal nonob CAD w/ trivial AV calcification, pt release prn)    History of diverticulitis of colon 2016   History of pulmonary embolus (PE) previously followed by hematology/ oncology--- dr Alen Blew;   now followed by pcp   07/ 2015  bilateral PE, provoked by long travel and smoking;  long term anticoagulant recommended  due to positive lupus anticoagulant   Hypertension    followed by pcp   Lower urinary tract symptoms (LUTS)    Lupus anticoagulant positive    2015   Mixed hyperlipidemia    Multinodular thyroid     known since 1999;   hx left nodule bx 01/ 2004 (care every where) [place];   03-16-2007  s/p  left thyroid lobectomy due to narrowing of airway (ademoutanous thyroid isthmus nodule's, 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) ;  hx right nodule bx 06-28-2009 (in epic)  benign   OA (osteoarthritis)    Recurrent malignant neoplasm of bladder [facility])    urologist-- dr Tresa Moore--- first dx 2012  s/p  TURBT   S/P medial meniscus repair of left knee     Past Surgical History:  Procedure Laterality Date   CYSTOSCOPY WITH URETHRAL DILATATION N/A 08/23/2019   Procedure: CYSTOSCOPY WITH URETHRAL DILATATION;  Surgeon: [name], MD;  Location: [facility];  Service: Urology;  Laterality: N/A;  1 HR   LACERATION REPAIR  yrs ago   repair complex laceration/ repair  left palm injury   REVISION AMPUTATION OF FINGER  yrs ago   right index finger   SEPTOPLASTY  yrs ago   THYROID LOBECTOMY Left 03-22-2007    dr Hassell Done @[facility]    AND ISTHMUSECTOMY   TRANSURETHRAL RESECTION OF BLADDER TUMOR  10-21-2010  dr Jasmine December @[facility]    TRANSURETHRAL RESECTION OF BLADDER TUMOR N/A 05/12/2019   Procedure: TRANSURETHRAL RESECTION OF BLADDER TUMOR (TURBT);  Surgeon: [name], MD;  Location: [facility];  Service: Urology;  Laterality: N/A;   TRANSURETHRAL RESECTION OF PROSTATE N/A 05/12/2019  Procedure: TRANSURETHRAL RESECTION OF THE PROSTATE (TURP);  Surgeon: [name], MD;  Location: [facility];  Service: Urology;  Laterality: N/A;  75 MINS   UMBILICAL HERNIA REPAIR  05-05-2010   DR Hassell Done @[facility]     Social History   Socioeconomic History   Marital status: Married    Spouse name: Not on file   Number of children: Not on file   Years of education: Not on file   Highest education level: Not on file  Occupational History   Not on file  Tobacco Use   Smoking status: Former    Packs/day: 2.00    Years: 35.00    Pack years: 70.00    Types: Cigarettes, Pipe, Cigars    Quit date: 05/07/2013    Years since quitting:  7.8   Smokeless tobacco: Former    Types: Chew    Quit date: 05/07/2013   Tobacco comments:    05-08-2019  per pt quit smoking cig. approx. 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    Social Determinants of Health   Financial Resource Strain: Not on file  Food Insecurity: Not on file  Transportation Needs: Not on file  Physical Activity: Not on file  Stress: Not on file  Social Connections: Not on file  Intimate Partner Violence: Not on file    Family History  Problem Relation Age of Onset   Hypertension Mother    Diabetes Mother    Hypertension Father     Current Outpatient Medications  Medication Sig Dispense Refill   finasteride (PROSCAR) 5 MG tablet Take 5 mg by mouth daily.     Multiple Vitamin (MULTIVITAMIN) capsule Take 1 capsule by mouth daily.     olmesartan-hydrochlorothiazide (BENICAR HCT) 40-25 MG tablet Take 1 tablet by mouth daily.     rosuvastatin (CRESTOR) 20 MG tablet Take 10 mg by mouth daily.      senna-docusate (SENOKOT-S) 8.6-50 MG tablet Take 1 tablet by mouth 2 (two) times daily. While taking pain meds to prevent constipation 10 tablet 0   sildenafil (VIAGRA) 100 MG tablet Take 100 mg by mouth daily as needed for erectile dysfunction.      sulfamethoxazole-trimethoprim (BACTRIM DS) 800-160 MG tablet Take 1 tablet by mouth 2 (two) times daily. X 3 days. Begin day before next Urology appointment. 6 tablet 0   XARELTO 20 MG TABS tablet Take 20 mg by mouth daily.     No current facility-administered medications for this visit.    Allergies  Allergen Reactions   Lipitor [Atorvastatin] Anxiety and Other  (See Comments)    Patient stated,"anxiety, increased heart rate, nervousness."   Vytorin [Ezetimibe-Simvastatin] Anxiety and Other (See Comments)    Patient stated,"anxiety, increased heart rate, nervousness."     REVIEW OF SYSTEMS:   [X]  denotes positive finding, [ ]  denotes negative finding Cardiac  Comments:  Chest pain or chest pressure:    Shortness of breath upon exertion:    Short of breath when lying flat:    Irregular heart rhythm:  Vascular    Pain in calf, thigh, or hip brought on by ambulation:    Pain in feet at night that wakes you up from your sleep:     Blood clot in your veins:    Leg swelling:         Pulmonary    Oxygen at home:    Productive cough:     Wheezing:         Neurologic    Sudden weakness in arms or legs:     Sudden numbness in arms or legs:     Sudden onset of difficulty speaking or slurred speech:    Temporary loss of vision in one eye:     Problems with dizziness:         Gastrointestinal    Blood in stool:     Vomited blood:         Genitourinary    Burning when urinating:     Blood in urine:        Psychiatric    Major depression:         Hematologic    Bleeding problems:    Problems with blood clotting too easily:        Skin    Rashes or ulcers:        Constitutional    Fever or chills:      PHYSICAL EXAMINATION:  There were no vitals filed for this visit.  General:  WDWN in NAD; vital signs documented above Gait: Not observed HENT: WNL, normocephalic Pulmonary: normal non-labored breathing Cardiac: regular HR Abdomen: soft, NT, no masses Skin: without rashes Vascular Exam/Pulses:  Right Left  Radial 2+ (normal) 2+ (normal)  Ulnar 2+ (normal) 2+ (normal)  Femoral    Popliteal    DP 2+ (normal) 2+ (normal)  PT 2+ (normal) 2+ (normal)   Extremities: without ischemic changes, without Gangrene , without cellulitis; without open wounds;  Musculoskeletal: no muscle wasting or atrophy  Neurologic: A&O X  3;  No focal weakness or paresthesias are detected Psychiatric:  The pt has Normal affect.   Non-Invasive Vascular Imaging:       ASSESSMENT/PLAN: Gerald Morales is a 71 y.o. 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 He is asymptomatic. Imaging was reviewed dating back to 2016.  Since 2016, his celiac trunk has increased in diameter by roughly 1 mm.  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 Society for vascular surgery guidelines recommend the repair of celiac artery aneurysms when greater than 2 cm in size.  At this point, we can continue to follow with yearly ultrasounds.    At this size, the risk of rupture is minimal.  I asked Gerald Morales to seek immediate medical attention should new onset, severe abdominal pain, back pain, chest pain occur.  Gerald John, MD [facility] [phone]

## 2021-03-14 ENCOUNTER — Encounter: Payer: Self-pay | Admitting: Vascular Surgery

## 2021-03-14 ENCOUNTER — Other Ambulatory Visit: Payer: Self-pay

## 2021-03-14 ENCOUNTER — Ambulatory Visit: Payer: Medicare HMO | Admitting: Vascular Surgery

## 2021-03-14 VITALS — BP 98/65 | HR 82 | Temp 98.2°F | Resp 20 | Ht 71.0 in | Wt 249.0 lb

## 2021-03-14 DIAGNOSIS — I728 Aneurysm of other specified arteries: Secondary | ICD-10-CM

## 2021-03-17 ENCOUNTER — Ambulatory Visit
Admission: RE | Admit: 2021-03-17 | Discharge: 2021-03-17 | Disposition: A | Discharge status: Home / Self Care | Payer: No Typology Code available for payment source | Source: Ambulatory Visit | Attending: Internal Medicine | Admitting: Internal Medicine

## 2021-03-17 ENCOUNTER — Other Ambulatory Visit: Payer: Self-pay

## 2021-03-17 DIAGNOSIS — I2510 Atherosclerotic heart disease of native coronary artery without angina pectoris: Secondary | ICD-10-CM | POA: Diagnosis not present

## 2021-03-17 DIAGNOSIS — E782 Mixed hyperlipidemia: Secondary | ICD-10-CM

## 2021-03-20 DIAGNOSIS — K5730 Diverticulosis of large intestine without perforation or abscess without bleeding: Secondary | ICD-10-CM | POA: Diagnosis not present

## 2021-03-20 DIAGNOSIS — D125 Benign neoplasm of sigmoid colon: Secondary | ICD-10-CM | POA: Diagnosis not present

## 2021-03-20 DIAGNOSIS — R933 Abnormal findings on diagnostic imaging of other parts of digestive tract: Secondary | ICD-10-CM | POA: Diagnosis not present

## 2021-03-20 DIAGNOSIS — Z86010 Personal history of colonic polyps: Secondary | ICD-10-CM | POA: Diagnosis not present

## 2021-03-20 DIAGNOSIS — K649 Unspecified hemorrhoids: Secondary | ICD-10-CM | POA: Diagnosis not present

## 2021-03-25 DIAGNOSIS — D125 Benign neoplasm of sigmoid colon: Secondary | ICD-10-CM | POA: Diagnosis not present

## 2021-03-26 ENCOUNTER — Other Ambulatory Visit: Payer: Self-pay

## 2021-03-26 ENCOUNTER — Encounter: Payer: Self-pay | Admitting: Internal Medicine

## 2021-03-26 ENCOUNTER — Ambulatory Visit: Payer: Medicare HMO | Admitting: Internal Medicine

## 2021-03-26 VITALS — BP 128/80 | HR 76 | Temp 97.9°F | Ht 71.0 in | Wt 251.0 lb

## 2021-03-26 DIAGNOSIS — K219 Gastro-esophageal reflux disease without esophagitis: Secondary | ICD-10-CM

## 2021-03-26 DIAGNOSIS — R053 Chronic cough: Secondary | ICD-10-CM

## 2021-03-26 MED ORDER — PANTOPRAZOLE SODIUM 40 MG PO TBEC: 40.0000 mg | DELAYED_RELEASE_TABLET | Freq: Every day | ORAL | 3 refills | Status: AC

## 2021-03-26 NOTE — Patient Instructions (Signed)
Please schedule follow up scheduled with myself in 2 months.  If my schedule is not open yet, we will contact you with a reminder closer to that time. Please call [phone] if you haven't heard from [place] a month before.   Start taking omeprazole 1 pill once a day.    What is GERD? Gastroesophageal reflux disease (GERD) is gastroesophageal reflux diseasewhich occurs when the lower esophageal sphincter (LES) opens spontaneously, for varying periods of time, or does not close properly and stomach contents rise up into the esophagus. GER is also called acid reflux or acid regurgitation, because digestive juices--called acids--rise up with the food. The esophagus is the tube that carries food from the mouth to the stomach. The LES is a ring of muscle at the bottom of the esophagus that acts like a valve between the esophagus and stomach.  When acid reflux occurs, food or fluid can be tasted in the back of the mouth. When refluxed stomach acid touches the lining of the esophagus it may cause a burning sensation in the chest or throat called heartburn or acid indigestion. Occasional reflux is common. Persistent reflux that occurs more than twice a week is considered GERD, and it can eventually lead to more serious health problems. People of all ages can have GERD. Studies have shown that GERD may worsen or contribute to asthma, chronic cough, and pulmonary fibrosis.   What are the symptoms of GERD? The main symptom of GERD in adults is frequent heartburn, also called acid indigestion--burning-type pain in the lower part of the mid-chest, behind the breast bone, and in the mid-abdomen.  Not all reflux is acidic in nature, and many patients don't have heart burn at all. Sometimes it feels like a cough (either dry or with mucus), choking sensation, asthma, shortness of breath, waking up at night, frequent throat clearing, or trouble swallowing.    What causes GERD? The reason some people develop GERD is  still unclear. However, research shows that in people with GERD, the LES relaxes while the rest of the esophagus is working. Anatomical abnormalities such as a hiatal hernia may also contribute to GERD. A hiatal hernia occurs when the upper part of the stomach and the LES move above the diaphragm, the muscle wall that separates the stomach from the chest. Normally, the diaphragm helps the LES keep acid from rising up into the esophagus. When a hiatal hernia is present, acid reflux can occur more easily. A hiatal hernia can occur in people of any age and is most often a normal finding in otherwise healthy people over age 37. Most of the time, a hiatal hernia produces no symptoms.   Other factors that may contribute to GERD include - Obesity or recent weight gain - Pregnancy  - Smoking  - Diet - Certain medications  Common foods that can worsen reflux symptoms include: - carbonated beverages - artificial sweeteners - citrus fruits  - chocolate  - drinks with caffeine or alcohol  - fatty and fried foods  - garlic and onions  - mint flavorings  - spicy foods  - tomato-based foods, like spaghetti sauce, salsa, chili, and pizza   Lifestyle Changes If you smoke, stop.  Avoid foods and beverages that worsen symptoms (see above.) Lose weight if needed.  Eat small, frequent meals.  Wear loose-fitting clothes.  Avoid lying down for 3 hours after a meal.  Raise the head of your bed 6 to 8 inches by securing wood blocks under the bedposts.  Just using extra pillows will not help, but using a wedge-shaped pillow may be helpful.  Medications  H2 blockers, such as cimetidine (Tagamet HB), famotidine (Pepcid AC), nizatidine (Axid AR), and ranitidine (Zantac 75), decrease acid production. They are available in prescription strength and over-the-counter strength. These drugs provide short-term relief and are effective for about half of those who have GERD symptoms.  Proton pump inhibitors include  omeprazole (Prilosec, Zegerid), lansoprazole (Prevacid), pantoprazole (Protonix), rabeprazole (Aciphex), and esomeprazole (Nexium), which are available by prescription. Prilosec is also available in over-the-counter strength. Proton pump inhibitors are more effective than H2 blockers and can relieve symptoms and heal the esophageal lining in almost everyone who has GERD.  Because drugs work in different ways, combinations of medications may help control symptoms. People who get heartburn after eating may take both antacids and H2 blockers. The antacids work first to neutralize the acid in the stomach, and then the H2 blockers act on acid production. By the time the antacid stops working, the H2 blocker will have stopped acid production. Your health care provider is the best source of information about how to use medications for GERD.   Points to Remember 1. You can have GERD without having heartburn. Your symptoms could include a dry cough, asthma symptoms, or trouble swallowing.  2. Taking medications daily as prescribed is important in controlling you symptoms.  Sometimes it can take up to 8 weeks to fully achieve the effects of the medications prescribed.  3. Coughing related to GERD can be difficult to treat and is very frustrating!  However, it is important to stick with these medications and lifestyle modifications before pursuing more aggressive or invasive test and treatments.

## 2021-03-26 NOTE — Progress Notes (Signed)
Gerald Morales    [id number]    01/14/1951  Primary Care Physician:Pharr, Thayer Jew, MD  Referring Physician: Deland Pretty, MD [address] [place],  [place] [phone] Reason for Consultation: chronic cough Date of Consultation: 03/26/2021  Chief complaint:   Chief Complaint  Patient presents with   Consult    Dry cough x years       HPI: Gerald Morales is a 71 y.o. man who presents with dry cough for many years. He has additional history of pulmonary embolism and is on lifelong AC due to a positive lupus AC.   He has a history deviated septum and had a reconstruction.   He does cough at night after laying down. It does not wake him at night. Wife has also noticed it.  His daughter thought his cough might be reflux. He took a medicine for 3 days and it didn't help at all.  Does have post nasal drainage. He denies seasonal allergies.   Cough is dry, no sputum production.  Denies dyspnea Has occasional tightness/discomfort across his lower chest. Usually related to movement. Lasts for 1-2 minutes, better when he takes a deep breath in, goes away on its own.   Never had pneumonia or bronchitis.   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  Smoking history: smoked a pipe, not cigarettes as much. And then cigars, quit in 2015  Social History   Occupational History   Not on file  Tobacco Use   Smoking status: Former    Packs/day: 2.00    Years: 35.00    Pack years: 70.00    Types: Cigarettes, Pipe, Cigars    Quit date: 05/07/2013    Years since quitting: 7.8   Smokeless tobacco: Former    Types: Chew    Quit date: 05/07/2013   Tobacco comments:    05-08-2019  per pt quit smoking cig. approx. 2015/  quit cigars 2014;  quit pipe 2005   Vaping Use   Vaping Use: Never used  Substance and Sexual Activity   Alcohol use: Yes    Alcohol/week: 14.0 standard drinks     Types: 14 Cans of beer per week    Comment: 2-4 beers daily   Drug use: Never   Sexual activity: Not Currently    Relevant family history:  Family History  Problem Relation Age of Onset   Hypertension Mother    Diabetes Mother    Hypertension Father     Past Medical History:  Diagnosis Date   Anticoagulant long-term use    xarelto    Coronary artery disease    mild nonobstructive by coronary CTA 2019   History of chest pain    all documented in epic/   with cardiac evaluation's in 2008 and 2019  (10-07-2006 negative myoview for ischemia, ef 57% pt released prn by dr Stanford Breed, note in [place])  and (evaluated 06-16-2017 by Ermalinda Barrios PA, atypical cp CTA 08-03-2017 calcium score 18 w/ miminal nonob CAD w/ trivial AV calcification, pt release prn)    History of diverticulitis of colon 2016   History of pulmonary embolus (PE) previously followed by hematology/ oncology--- dr Alen Blew;   now followed by pcp   07/ 2015  bilateral PE, provoked by long travel and smoking;  long term anticoagulant recommended  due to positive lupus anticoagulant   Hypertension    followed  by pcp   Lower urinary tract symptoms (LUTS)    Lupus anticoagulant positive    2015   Mixed hyperlipidemia    Multinodular thyroid    known since 1999;   hx left nodule bx 01/ 2004 (care every where) [place];   03-16-2007  s/p  left thyroid lobectomy due to narrowing of airway (ademoutanous thyroid isthmus nodule's, 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) ;  hx right nodule bx 06-28-2009 (in epic)  benign   OA (osteoarthritis)    Recurrent malignant neoplasm of bladder [facility])    urologist-- dr Tresa Moore--- first dx 2012  s/p  TURBT   S/P medial meniscus repair of left knee     Past Surgical History:  Procedure Laterality Date   CYSTOSCOPY WITH URETHRAL DILATATION N/A 08/23/2019   Procedure: CYSTOSCOPY WITH URETHRAL DILATATION;  Surgeon: [name], MD;  Location: [facility];  Service: Urology;  Laterality: N/A;  1 HR   LACERATION REPAIR  yrs  ago   repair complex laceration/ repair  left palm injury   REVISION AMPUTATION OF FINGER  yrs ago   right index finger   SEPTOPLASTY  yrs ago   THYROID LOBECTOMY Left 03-22-2007    dr Hassell Done @[facility]    AND ISTHMUSECTOMY   TRANSURETHRAL RESECTION OF BLADDER TUMOR  10-21-2010  dr Jasmine December @[facility]    TRANSURETHRAL RESECTION OF BLADDER TUMOR N/A 05/12/2019   Procedure: TRANSURETHRAL RESECTION OF BLADDER TUMOR (TURBT);  Surgeon: [name], MD;  Location[facility];  Service: Urology;  Laterality: N/A;   TRANSURETHRAL RESECTION OF PROSTATE N/A 05/12/2019   Procedure: TRANSURETHRAL RESECTION OF THE PROSTATE (TURP);  Surgeon: [name], MD;  Location: [facility];  Service: Urology;  Laterality: N/A;  75 MINS   UMBILICAL HERNIA REPAIR  05-05-2010   DR Hassell Done @[facility]      Physical Exam: Blood pressure 128/80, pulse 76, temperature 97.9 F (36.6 C), temperature source Oral, height 5\' 11"  (1.803 m), weight 251 lb (113.9 kg), SpO2 97 %. Gen:      No acute distress ENT:  mild nasal debris, no nasal polyps, mucus membranes moist Lungs:    No increased respiratory effort, symmetric chest wall excursion, clear to auscultation bilaterally, no wheezes or crackles CV:         Regular rate and rhythm; no murmurs, rubs, or gallops.  No pedal edema Abd:      Obese, + bowel sounds; soft, non-tender; no distension MSK: no acute synovitis of DIP or PIP joints, no mechanics hands.  Skin:      Warm and dry; no rashes Neuro: normal speech, no focal facial asymmetry Psych: alert and oriented x3, normal mood and affect   Data Reviewed/Medical Decision Making:  Independent interpretation of tests: Imaging:  Review of patient's chest xray 2019 images revealed no acute process. The patient's images have been independently reviewed by me.    PFTs: I have personally reviewed the patient's PFTs and in 2017 spirometry was normal, diffusion capacity reduced PFT Results Latest Ref Rng &  Units 01/10/2016  FVC-Pre L 3.87  FVC-Predicted Pre % 82  FVC-Post L 3.44  FVC-Predicted Post % 73  Pre FEV1/FVC % % 78  Post FEV1/FCV % % 86  FEV1-Pre L 3.03  FEV1-Predicted Pre % 87  FEV1-Post L 2.95  DLCO uncorrected ml/min/mmHg 25.93  DLCO UNC% % 76  DLVA Predicted % 103  TLC L 5.99  TLC % Predicted % 82  RV % Predicted % 88    Labs: labs reviewed from Dr Pennie Banter  office Hgb 15.8 Platelets 211 WBC 7.7 BMP wnl  Immunization status:  Immunization History  Administered Date(s) Administered   Influenza-Unspecified 10/09/2020   Tdap 09/14/2017     I reviewed prior external note(s) from Dr Shelia Media  I reviewed the result(s) of the labs and imaging as noted above.   Assessment:  Chronic Cough Pulmonary embolism, provoked in the setting of tobacco use and prolonged travel History of nasal septal deviation, s/p surgery  Plan/Recommendations: Chronic cough worse at night most likely related to GERD. Will initiate trial of PPI.  Discussed lifestyle recommendations for GERD.  If his lupus anticoagulant was drawn while on anti-coagulation, there is a high chance it is a false positive. He is on lifelong anticoagulation for now based on PCP/hematology recs. 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    Return to Care: Return in about 2 months (around 05/24/2021).  Lenice Llamas, MD Pulmonary and [place]  CC: Deland Pretty, MD

## 2021-03-27 DIAGNOSIS — N453 Epididymo-orchitis: Secondary | ICD-10-CM | POA: Diagnosis not present

## 2021-04-21 DIAGNOSIS — I700 Atherosclerosis of aorta: Secondary | ICD-10-CM | POA: Diagnosis not present

## 2021-04-21 DIAGNOSIS — I2510 Atherosclerotic heart disease of native coronary artery without angina pectoris: Secondary | ICD-10-CM | POA: Diagnosis not present

## 2021-05-21 DIAGNOSIS — R7303 Prediabetes: Secondary | ICD-10-CM | POA: Diagnosis not present

## 2021-05-21 DIAGNOSIS — I2510 Atherosclerotic heart disease of native coronary artery without angina pectoris: Secondary | ICD-10-CM | POA: Diagnosis not present

## 2021-06-17 ENCOUNTER — Ambulatory Visit: Payer: Medicare HMO | Admitting: Internal Medicine

## 2021-06-17 ENCOUNTER — Encounter: Payer: Self-pay | Admitting: Internal Medicine

## 2021-06-17 VITALS — BP 126/80 | HR 88 | Ht 71.0 in | Wt 257.0 lb

## 2021-06-17 DIAGNOSIS — R053 Chronic cough: Secondary | ICD-10-CM

## 2021-06-17 DIAGNOSIS — M25571 Pain in right ankle and joints of right foot: Secondary | ICD-10-CM | POA: Diagnosis not present

## 2021-06-17 NOTE — Progress Notes (Signed)
? ?      ?Gerald Morales    [id number]    [birth year] ? ?Primary Care Physician:Pharr, Thayer Jew, MD ?Date of Appointment: 06/17/2021 ?Established Patient Visit ? ?Chief complaint:   ?Chief Complaint  ?Patient presents with  ? Follow-up  ?  F/U for cough. States he has is not able to tell a difference in his cough since last visit.   ? ? ? ?HPI: ?Gerald Morales is a 71 y.o. man with chronic cough.  ? ?Interval Updates: ?Here for follow up after trial of PPI.  ?Thinks the cough is not as noticeable as it was before. Not waking him up at night.  ?Breathing is doing ok. No hospitalizations or ED visits.  ?Mostly just having problem with his ankle.  ?No bleeding issues on xarelto.  ?I have reviewed the patient's family social and past medical history and updated as appropriate.  ? ?Past Medical History:  ?Diagnosis Date  ? Anticoagulant long-term use   ? xarelto   ? Coronary artery disease   ? mild nonobstructive by coronary CTA 2019  ? History of chest pain   ? all documented in epic/   with cardiac evaluation's in 2008 and 2019  (10-07-2006 negative myoview for ischemia, ef 57% pt released prn by dr Stanford Breed, note in [place])  and (evaluated 06-16-2017 by Ermalinda Barrios PA, atypical cp CTA 08-03-2017 calcium score 18 w/ miminal nonob CAD w/ trivial AV calcification, pt release prn)   ? History of diverticulitis of colon 2016  ? History of pulmonary embolus (PE) previously followed by hematology/ oncology--- dr Alen Blew;   now followed by pcp  ? 07/ 2015  bilateral PE, provoked by long travel and smoking;  long term anticoagulant recommended  due to positive lupus anticoagulant  ? Hypertension   ? followed by pcp  ? Lower urinary tract symptoms (LUTS)   ? Lupus anticoagulant positive   ? 2015  ? Mixed hyperlipidemia   ? Multinodular thyroid   ? known since 1999;   hx left nodule bx 01/ 2004 (care every where) [place];   03-16-2007  s/p  left thyroid lobectomy due to narrowing of airway (ademoutanous thyroid isthmus  nodule's, 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) ;  hx right nodule bx 06-28-2009 (in epic)  benign  ? OA (osteoarthritis)   ? Recurrent malignant neoplasm of bladder ([place])   ? urologist-- dr Tresa Moore--- first dx 2012  s/p  TURBT  ? S/P medial meniscus repair of left knee   ? ? ?Past Surgical History:  ?Procedure Laterality Date  ? CYSTOSCOPY WITH URETHRAL DILATATION N/A 08/23/2019  ? Procedure: CYSTOSCOPY WITH URETHRAL DILATATION;  Surgeon: [name], MD;  Location: [facility];  Service: Urology;  Laterality: N/A;  1 HR  ? LACERATION REPAIR  yrs ago  ? repair complex laceration/ repair  left palm injury  ? REVISION AMPUTATION OF FINGER  yrs ago  ? right index finger  ? SEPTOPLASTY  yrs ago  ? THYROID LOBECTOMY Left 03-22-2007    dr Hassell Done '@[facility]   ? AND ISTHMUSECTOMY  ? TRANSURETHRAL RESECTION OF BLADDER TUMOR  10-21-2010  dr Jasmine December '@[facility]   ? TRANSURETHRAL RESECTION OF BLADDER TUMOR N/A 05/12/2019  ? Procedure: TRANSURETHRAL RESECTION OF BLADDER TUMOR (TURBT);  Surgeon: [name], MD;  Location: [facility];  Service: Urology;  Laterality: N/A;  ? TRANSURETHRAL RESECTION OF PROSTATE N/A 05/12/2019  ? Procedure: TRANSURETHRAL RESECTION OF THE PROSTATE (TURP);  Surgeon: [name], MD;  [facility];  Service: Urology;  Laterality: N/A;  75 MINS  ? UMBILICAL HERNIA REPAIR  05-05-2010   DR Hassell Done '@[facility]'$   ? ? ?Family History  ?Problem Relation Age of Onset  ? Hypertension Mother   ? Diabetes Mother   ? Hypertension Father   ? ? ?Social History  ? ?Occupational History  ? Not on file  ?Tobacco Use  ? Smoking status: Former  ?  Packs/day: 2.00  ?  Years: 35.00  ?  Pack years: 70.00  ?  Types: Cigarettes, Pipe, Cigars  ?  Quit date: 05/07/2013  ?  Years since quitting: 8.1  ? Smokeless tobacco: Former  ?  Types: Chew  ?  Quit date: 05/07/2013  ? Tobacco comments:  ?  05-08-2019  per pt quit smoking cig. approx. 2015/  quit cigars 2014;  quit pipe 2005   ?Vaping Use  ? Vaping Use: Never used  ?Substance and  Sexual Activity  ? Alcohol use: Yes  ?  Alcohol/week: 14.0 standard drinks  ?  Types: 14 Cans of beer per week  ?  Comment: 2-4 beers daily  ? Drug use: Never  ? Sexual activity: Not Currently  ? ? ? ?Physical Exam: ?Blood pressure 126/80, pulse 88, height '5\' 11"'$  (1.803 m), weight 257 lb (116.6 kg), SpO2 97 %. ? ?Gen:      No acute distress, truncal obesity ?ENT:  no nasal polyps, mucus membranes moist ?Lungs:    No increased respiratory effort, symmetric chest wall excursion, clear to auscultation bilaterally, no wheezes or crackles ?CV:         Regular rate and rhythm; no murmurs, rubs, or gallops.  No pedal edema ? ? ?Data Reviewed: ?Imaging: ?I have personally reviewed the \ ? ?PFTs: ? ? ?  Latest Ref Rng & Units 01/10/2016  ?  9:05 AM  ?PFT Results  ?FVC-Pre L 3.87    ?FVC-Predicted Pre % 82    ?FVC-Post L 3.44    ?FVC-Predicted Post % 73    ?Pre FEV1/FVC % % 78    ?Post FEV1/FCV % % 86    ?FEV1-Pre L 3.03    ?FEV1-Predicted Pre % 87    ?FEV1-Post L 2.95    ?DLCO uncorrected ml/min/mmHg 25.93    ?DLCO UNC% % 76    ?DLVA Predicted % 103    ?TLC L 5.99    ?TLC % Predicted % 82    ?RV % Predicted % 88    ? ?I have personally reviewed the patient's PFTs and no airflow limitation.  ? ?Labs: ? ?Immunization status: ?Immunization History  ?Administered Date(s) Administered  ? Influenza-Unspecified 10/09/2020  ? Tdap 09/14/2017  ? ? ?External Records Personally Reviewed:  ? ?Assessment:  ?Chronic cough, GERD, improved.  ?Post nasal drainage. ? ?Plan/Recommendations: ?Continue PPI for reflux - lifestyle precautions given ?Prn anti-histamines for post nasal drainage.  ? ? ? ?Return to Care: ?Return if symptoms worsen or fail to improve. ? ? ?Lenice Llamas, MD ?Pulmonary and Critical Care Medicine ?[place] ?Office:[phone] ? ? ? ? ? ?

## 2021-06-17 NOTE — Patient Instructions (Signed)
Follow up as needed if symptoms don't improve. ? ? ?Continue the medicine for acid reflux. Dr. Shelia Media can take over prescribing this for you. It is also available over the counter.  ? ?What is GERD? ?Gastroesophageal reflux disease (GERD) is gastroesophageal reflux diseasewhich occurs when the lower esophageal sphincter (LES) opens spontaneously, for varying periods of time, or does not close properly and stomach contents rise up into the esophagus. GER is also called acid reflux or acid regurgitation, because digestive juices--called acids--rise up with the food. The esophagus is the tube that carries food from the mouth to the stomach. The LES is a ring of muscle at the bottom of the esophagus that acts like a valve between the esophagus and stomach. ? ?When acid reflux occurs, food or fluid can be tasted in the back of the mouth. When refluxed stomach acid touches the lining of the esophagus it may cause a burning sensation in the chest or throat called heartburn or acid indigestion. Occasional reflux is common. Persistent reflux that occurs more than twice a week is considered GERD, and it can eventually lead to more serious health problems. People of all ages can have GERD. Studies have shown that GERD may worsen or contribute to asthma, chronic cough, and pulmonary fibrosis. ? ? ?What are the symptoms of GERD? ?The main symptom of GERD in adults is frequent heartburn, also called acid indigestion--burning-type pain in the lower part of the mid-chest, behind the breast bone, and in the mid-abdomen.  Not all reflux is acidic in nature, and many patients don't have heart burn at all. Sometimes it feels like a cough (either dry or with mucus), choking sensation, asthma, shortness of breath, waking up at night, frequent throat clearing, or trouble swallowing.  ? ? ?What causes GERD? ?The reason some people develop GERD is still unclear. However, research shows that in people with GERD, the LES relaxes while the rest  of the esophagus is working. Anatomical abnormalities such as a hiatal hernia may also contribute to GERD. A hiatal hernia occurs when the upper part of the stomach and the LES move above the diaphragm, the muscle wall that separates the stomach from the chest. Normally, the diaphragm helps the LES keep acid from rising up into the esophagus. When a hiatal hernia is present, acid reflux can occur more easily. A hiatal hernia can occur in people of any age and is most often a normal finding in otherwise healthy people over age 35. Most of the time, a hiatal hernia produces no symptoms.  ? ?Other factors that may contribute to GERD include ?- Obesity or recent weight gain ?- Pregnancy  ?- Smoking  ?- Diet ?- Certain medications ? ?Common foods that can worsen reflux symptoms include: ?- carbonated beverages ?- artificial sweeteners ?- citrus fruits  ?- chocolate  ?- drinks with caffeine or alcohol  ?- fatty and fried foods  ?- garlic and onions  ?- mint flavorings  ?- spicy foods  ?- tomato-based foods, like spaghetti sauce, salsa, chili, and pizza  ? ?Lifestyle Changes ?If you smoke, stop.  ?Avoid foods and beverages that worsen symptoms (see above.) ?Lose weight if needed.  ?Eat small, frequent meals.  ?Wear loose-fitting clothes.  ?Avoid lying down for 3 hours after a meal.  ?Raise the head of your bed 6 to 8 inches by securing wood blocks under the bedposts. Just using extra pillows will not help, but using a wedge-shaped pillow may be helpful. ? ?Medications ? ?H2 blockers, such  as cimetidine (Tagamet HB), famotidine (Pepcid AC), nizatidine (Axid AR), and ranitidine (Zantac 75), decrease acid production. They are available in prescription strength and over-the-counter strength. These drugs provide short-term relief and are effective for about half of those who have GERD symptoms. ? ?Proton pump inhibitors include omeprazole (Prilosec, Zegerid), lansoprazole (Prevacid), pantoprazole (Protonix), rabeprazole  (Aciphex), and esomeprazole (Nexium), which are available by prescription. Prilosec is also available in over-the-counter strength. Proton pump inhibitors are more effective than H2 blockers and can relieve symptoms and heal the esophageal lining in almost everyone who has GERD. ? ?Because drugs work in different ways, combinations of medications may help control symptoms. People who get heartburn after eating may take both antacids and H2 blockers. The antacids work first to neutralize the acid in the stomach, and then the H2 blockers act on acid production. By the time the antacid stops working, the H2 blocker will have stopped acid production. Your health care provider is the best source of information about how to use medications for GERD. ? ? ?Points to Remember ?1. You can have GERD without having heartburn. Your symptoms could include a dry cough, asthma symptoms, or trouble swallowing. ? ?2. Taking medications daily as prescribed is important in controlling you symptoms.  Sometimes it can take up to 8 weeks to fully achieve the effects of the medications prescribed. ? ?3. Coughing related to GERD can be difficult to treat and is very frustrating!  However, it is important to stick with these medications and lifestyle modifications before pursuing more aggressive or invasive test and treatments. ? ?

## 2021-08-18 DIAGNOSIS — Z86711 Personal history of pulmonary embolism: Secondary | ICD-10-CM | POA: Diagnosis not present

## 2021-08-18 DIAGNOSIS — Z6834 Body mass index (BMI) 34.0-34.9, adult: Secondary | ICD-10-CM | POA: Diagnosis not present

## 2021-08-18 DIAGNOSIS — Z809 Family history of malignant neoplasm, unspecified: Secondary | ICD-10-CM | POA: Diagnosis not present

## 2021-08-18 DIAGNOSIS — Z8249 Family history of ischemic heart disease and other diseases of the circulatory system: Secondary | ICD-10-CM | POA: Diagnosis not present

## 2021-08-18 DIAGNOSIS — Z7901 Long term (current) use of anticoagulants: Secondary | ICD-10-CM | POA: Diagnosis not present

## 2021-08-18 DIAGNOSIS — N529 Male erectile dysfunction, unspecified: Secondary | ICD-10-CM | POA: Diagnosis not present

## 2021-08-18 DIAGNOSIS — Z8551 Personal history of malignant neoplasm of bladder: Secondary | ICD-10-CM | POA: Diagnosis not present

## 2021-08-18 DIAGNOSIS — I10 Essential (primary) hypertension: Secondary | ICD-10-CM | POA: Diagnosis not present

## 2021-08-18 DIAGNOSIS — Z791 Long term (current) use of non-steroidal anti-inflammatories (NSAID): Secondary | ICD-10-CM | POA: Diagnosis not present

## 2021-08-18 DIAGNOSIS — E785 Hyperlipidemia, unspecified: Secondary | ICD-10-CM | POA: Diagnosis not present

## 2021-08-18 DIAGNOSIS — N400 Enlarged prostate without lower urinary tract symptoms: Secondary | ICD-10-CM | POA: Diagnosis not present

## 2021-08-18 DIAGNOSIS — E669 Obesity, unspecified: Secondary | ICD-10-CM | POA: Diagnosis not present

## 2021-09-02 DIAGNOSIS — C678 Malignant neoplasm of overlapping sites of bladder: Secondary | ICD-10-CM | POA: Diagnosis not present

## 2021-09-16 ENCOUNTER — Other Ambulatory Visit: Payer: Medicare HMO

## 2021-09-16 ENCOUNTER — Other Ambulatory Visit: Payer: Self-pay | Admitting: Internal Medicine

## 2021-09-16 DIAGNOSIS — R109 Unspecified abdominal pain: Secondary | ICD-10-CM | POA: Diagnosis not present

## 2021-09-17 ENCOUNTER — Ambulatory Visit
Admission: RE | Admit: 2021-09-17 | Discharge: 2021-09-17 | Disposition: A | Discharge status: Home / Self Care | Payer: Medicare HMO | Source: Ambulatory Visit | Attending: Internal Medicine | Admitting: Internal Medicine

## 2021-09-17 DIAGNOSIS — K5730 Diverticulosis of large intestine without perforation or abscess without bleeding: Secondary | ICD-10-CM | POA: Diagnosis not present

## 2021-09-17 DIAGNOSIS — Z8551 Personal history of malignant neoplasm of bladder: Secondary | ICD-10-CM | POA: Diagnosis not present

## 2021-09-17 DIAGNOSIS — I700 Atherosclerosis of aorta: Secondary | ICD-10-CM | POA: Diagnosis not present

## 2021-09-17 DIAGNOSIS — R109 Unspecified abdominal pain: Secondary | ICD-10-CM

## 2021-12-12 DIAGNOSIS — Z23 Encounter for immunization: Secondary | ICD-10-CM | POA: Diagnosis not present

## 2021-12-15 DIAGNOSIS — D72829 Elevated white blood cell count, unspecified: Secondary | ICD-10-CM | POA: Diagnosis not present

## 2021-12-15 DIAGNOSIS — M1990 Unspecified osteoarthritis, unspecified site: Secondary | ICD-10-CM | POA: Diagnosis not present

## 2021-12-15 DIAGNOSIS — I10 Essential (primary) hypertension: Secondary | ICD-10-CM | POA: Diagnosis not present

## 2021-12-15 DIAGNOSIS — M7021 Olecranon bursitis, right elbow: Secondary | ICD-10-CM | POA: Diagnosis not present

## 2021-12-15 DIAGNOSIS — M25572 Pain in left ankle and joints of left foot: Secondary | ICD-10-CM | POA: Diagnosis not present

## 2021-12-17 DIAGNOSIS — M779 Enthesopathy, unspecified: Secondary | ICD-10-CM | POA: Diagnosis not present

## 2021-12-17 DIAGNOSIS — M2550 Pain in unspecified joint: Secondary | ICD-10-CM | POA: Diagnosis not present

## 2021-12-17 DIAGNOSIS — M25521 Pain in right elbow: Secondary | ICD-10-CM | POA: Diagnosis not present

## 2021-12-17 DIAGNOSIS — M25572 Pain in left ankle and joints of left foot: Secondary | ICD-10-CM | POA: Diagnosis not present

## 2021-12-17 DIAGNOSIS — M25421 Effusion, right elbow: Secondary | ICD-10-CM | POA: Diagnosis not present

## 2021-12-17 DIAGNOSIS — M109 Gout, unspecified: Secondary | ICD-10-CM | POA: Diagnosis not present

## 2021-12-17 DIAGNOSIS — M7020 Olecranon bursitis, unspecified elbow: Secondary | ICD-10-CM | POA: Diagnosis not present

## 2021-12-17 DIAGNOSIS — R700 Elevated erythrocyte sedimentation rate: Secondary | ICD-10-CM | POA: Diagnosis not present

## 2022-01-06 DIAGNOSIS — I10 Essential (primary) hypertension: Secondary | ICD-10-CM | POA: Diagnosis not present

## 2022-01-19 DIAGNOSIS — M25532 Pain in left wrist: Secondary | ICD-10-CM | POA: Diagnosis not present

## 2022-01-29 DIAGNOSIS — M1990 Unspecified osteoarthritis, unspecified site: Secondary | ICD-10-CM | POA: Diagnosis not present

## 2022-01-29 DIAGNOSIS — M109 Gout, unspecified: Secondary | ICD-10-CM | POA: Diagnosis not present

## 2022-01-29 DIAGNOSIS — M7020 Olecranon bursitis, unspecified elbow: Secondary | ICD-10-CM | POA: Diagnosis not present

## 2022-02-20 DIAGNOSIS — Z125 Encounter for screening for malignant neoplasm of prostate: Secondary | ICD-10-CM | POA: Diagnosis not present

## 2022-02-20 DIAGNOSIS — R7303 Prediabetes: Secondary | ICD-10-CM | POA: Diagnosis not present

## 2022-02-20 DIAGNOSIS — I10 Essential (primary) hypertension: Secondary | ICD-10-CM | POA: Diagnosis not present

## 2022-02-25 DIAGNOSIS — Z0000 Encounter for general adult medical examination without abnormal findings: Secondary | ICD-10-CM | POA: Diagnosis not present

## 2022-02-25 DIAGNOSIS — Z23 Encounter for immunization: Secondary | ICD-10-CM | POA: Diagnosis not present

## 2022-02-25 DIAGNOSIS — I10 Essential (primary) hypertension: Secondary | ICD-10-CM | POA: Diagnosis not present

## 2022-02-25 DIAGNOSIS — I440 Atrioventricular block, first degree: Secondary | ICD-10-CM | POA: Diagnosis not present

## 2022-02-25 DIAGNOSIS — I700 Atherosclerosis of aorta: Secondary | ICD-10-CM | POA: Diagnosis not present

## 2022-02-25 DIAGNOSIS — C678 Malignant neoplasm of overlapping sites of bladder: Secondary | ICD-10-CM | POA: Diagnosis not present

## 2022-02-25 DIAGNOSIS — I2510 Atherosclerotic heart disease of native coronary artery without angina pectoris: Secondary | ICD-10-CM | POA: Diagnosis not present

## 2022-02-25 DIAGNOSIS — N529 Male erectile dysfunction, unspecified: Secondary | ICD-10-CM | POA: Diagnosis not present

## 2022-02-25 DIAGNOSIS — R760 Raised antibody titer: Secondary | ICD-10-CM | POA: Diagnosis not present

## 2022-02-25 DIAGNOSIS — D6869 Other thrombophilia: Secondary | ICD-10-CM | POA: Diagnosis not present

## 2022-02-25 DIAGNOSIS — M109 Gout, unspecified: Secondary | ICD-10-CM | POA: Diagnosis not present

## 2022-03-24 DIAGNOSIS — H52223 Regular astigmatism, bilateral: Secondary | ICD-10-CM | POA: Diagnosis not present

## 2022-03-24 DIAGNOSIS — C678 Malignant neoplasm of overlapping sites of bladder: Secondary | ICD-10-CM | POA: Diagnosis not present

## 2022-03-24 DIAGNOSIS — H524 Presbyopia: Secondary | ICD-10-CM | POA: Diagnosis not present

## 2022-03-24 DIAGNOSIS — H5213 Myopia, bilateral: Secondary | ICD-10-CM | POA: Diagnosis not present

## 2022-04-01 DIAGNOSIS — Z79899 Other long term (current) drug therapy: Secondary | ICD-10-CM | POA: Diagnosis not present

## 2022-04-01 DIAGNOSIS — M7020 Olecranon bursitis, unspecified elbow: Secondary | ICD-10-CM | POA: Diagnosis not present

## 2022-04-01 DIAGNOSIS — M109 Gout, unspecified: Secondary | ICD-10-CM | POA: Diagnosis not present

## 2022-04-01 DIAGNOSIS — M1990 Unspecified osteoarthritis, unspecified site: Secondary | ICD-10-CM | POA: Diagnosis not present

## 2022-04-27 ENCOUNTER — Other Ambulatory Visit: Payer: Self-pay | Admitting: *Deleted

## 2022-04-27 DIAGNOSIS — I728 Aneurysm of other specified arteries: Secondary | ICD-10-CM

## 2022-05-07 NOTE — Progress Notes (Signed)
Office Note     CC: Celiac artery dilation Requesting Provider:  Deland Pretty, MD  HPI: Gerald Morales is a 72 y.o. (Jun 28, 1950) adult presenting for follow-up of Celiac artery ectasia.  Originally from [place], and alumni of [place], [place] now lives in [place]. 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 On exam today, Gerald Morales was doing well.  Gerald Morales had no complaints. Few episodes of abdominal pain in the last year which resolve with movement, nothing consistent.  Gerald Morales has normal bowel movements.  No food fear, no postprandial pain. Denies symptoms of claudication, rest pain, tissue loss in the feet.  The pt is  on a statin for cholesterol management.  The pt is - on a daily aspirin.   Other AC:  Xarelto The pt is  on medication for hypertension.   The pt is not diabetic.  Tobacco hx:  former  Past Medical History:  Diagnosis Date   Anticoagulant long-term use    xarelto    Coronary artery disease    mild nonobstructive by coronary CTA 2019   History of chest pain    all documented in epic/   with cardiac evaluation's in 2008 and 2019  (10-07-2006 negative myoview for ischemia, ef 57% pt released prn by dr Gerald Morales, note in [place])  and (evaluated 06-16-2017 by Gerald Barrios PA, atypical cp CTA 08-03-2017 calcium score 18 w/ miminal nonob CAD w/ trivial AV calcification, pt release prn)    History of diverticulitis of colon 2016   History of pulmonary embolus (PE) previously followed by hematology/ oncology--- dr Gerald Morales;   now followed by pcp   07/ 2015  bilateral PE, provoked by long travel and smoking;  long term anticoagulant recommended  due to positive lupus anticoagulant   Hypertension    followed by pcp   Lower urinary tract symptoms (LUTS)    Lupus anticoagulant positive    2015   Mixed hyperlipidemia    Multinodular thyroid    known since 1999;   hx left  nodule bx 01/ 2004 (care every where) [facility];   03-16-2007  s/p  left thyroid lobectomy due to narrowing of airway (ademoutanous thyroid isthmus nodule's, 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) ;  hx right nodule bx 06-28-2009 (in epic)  benign   OA (osteoarthritis)    Recurrent malignant neoplasm of bladder [facility])    urologist-- dr Gerald Morales--- first dx 2012  s/p  TURBT   S/P medial meniscus repair of left knee     Past Surgical History:  Procedure Laterality Date   CYSTOSCOPY WITH URETHRAL DILATATION N/A 08/23/2019   Procedure: CYSTOSCOPY WITH URETHRAL DILATATION;  Surgeon: Gerald Frock, MD;  Location: [facility];  Service: Urology;  Laterality: N/A;  1 HR   LACERATION REPAIR  yrs ago   repair complex laceration/ repair  left palm injury   REVISION AMPUTATION OF FINGER  yrs ago   right index finger   SEPTOPLASTY  yrs ago   THYROID LOBECTOMY Left 03-22-2007    dr Hassell Done @[facility]    AND ISTHMUSECTOMY   TRANSURETHRAL RESECTION OF BLADDER TUMOR  10-21-2010  dr Jasmine December @[facility]    TRANSURETHRAL RESECTION OF BLADDER TUMOR N/A [name]   Procedure: TRANSURETHRAL RESECTION OF BLADDER TUMOR (TURBT);  Surgeon: Gerald Frock, MD;  Location: [facility];  Service: Urology;  Laterality: N/A;   TRANSURETHRAL RESECTION OF PROSTATE N/A [name]   Procedure: TRANSURETHRAL RESECTION OF THE PROSTATE (TURP);  Surgeon: Gerald Frock, MD;  Location: [facility];  Service: Urology;  Laterality: N/A;  71 MINS   UMBILICAL HERNIA REPAIR  05-05-2010   DR Hassell Done @[facility]     Social History   Socioeconomic History   Marital status: Married    Spouse name: Not on file   Number of children: Not on file   Years of education: Not on file   Highest education level: Not on file  Occupational History   Not on file  Tobacco Use   Smoking status: Former    Packs/day: 2.00    Years: 35.00    Additional pack years: 0.00    Total pack years: 70.00    Types: Cigarettes, Pipe, Cigars    Quit date: [name]    Years since  quitting: 9.0   Smokeless tobacco: Former    Types: Chew    Quit date: [name]   Tobacco comments:    05-08-2019  per pt quit smoking cig. approx. 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    Social Determinants of Health   Financial Resource Strain: Not on file  Food Insecurity: Not on file  Transportation Needs: Not on file  Physical Activity: Not on file  Stress: Not on file  Social Connections: Not on file  Intimate Partner Violence: Not on file    Family History  Problem Relation Age of Onset   Hypertension Mother    Diabetes Mother    Hypertension Father     Current Outpatient Medications  Medication Sig Dispense Refill   finasteride (PROSCAR) 5 MG tablet Take 5 mg by mouth daily.     Multiple Vitamin (MULTIVITAMIN) capsule Take 1 capsule by mouth daily.     olmesartan-hydrochlorothiazide (BENICAR HCT) 40-25 MG tablet Take 1 tablet by mouth daily.     pantoprazole (PROTONIX) 40 MG tablet Take 1 tablet (40 mg total) by mouth daily. 30 tablet Morales   predniSONE (DELTASONE) 20 MG tablet Take 40 mg by mouth daily.     rosuvastatin (CRESTOR) 20 MG tablet Take 10 mg by mouth daily.      sildenafil (VIAGRA) 100 MG tablet Take 100 mg by mouth daily as needed for erectile dysfunction.      XARELTO 20 MG TABS tablet Take 20 mg by mouth daily.     No current facility-administered medications for this visit.    Allergies  Allergen Reactions   Lipitor [Atorvastatin] Anxiety and Other (See Comments)    Patient stated,"anxiety, increased heart rate, nervousness."   Vytorin [Ezetimibe-Simvastatin] Anxiety and Other (See  Comments)    Patient stated,"anxiety, increased heart rate, nervousness."     REVIEW OF SYSTEMS:   [X]  denotes positive finding, [ ]  denotes negative finding Cardiac  Comments:  Chest pain or chest pressure:    Shortness of breath upon exertion:    Short of breath when lying flat:    Irregular heart rhythm:        Vascular    Pain in calf, thigh, or hip brought on  by ambulation:    Pain in feet at night that wakes you up from your sleep:     Blood clot in your veins:    Leg swelling:         Pulmonary    Oxygen at home:    Productive cough:     Wheezing:         Neurologic    Sudden weakness in arms or legs:     Sudden numbness in arms or legs:     Sudden onset of difficulty speaking or slurred speech:    Temporary loss of vision in one eye:     Problems with dizziness:         Gastrointestinal    Blood in stool:     Vomited blood:         Genitourinary    Burning when urinating:     Blood in urine:        Psychiatric    Major depression:         Hematologic    Bleeding problems:    Problems with blood clotting too easily:        Skin    Rashes or ulcers:        Constitutional    Fever or chills:      PHYSICAL EXAMINATION:  There were no vitals filed for this visit.  General:  WDWN in NAD; vital signs documented above Gait: Not observed HENT: WNL, normocephalic Pulmonary: normal non-labored breathing Cardiac: regular HR Abdomen: soft, NT, no masses Skin: without rashes Vascular Exam/Pulses:  Right Left  Radial 2+ (normal) 2+ (normal)  Ulnar 2+ (normal) 2+ (normal)  Femoral    Popliteal    DP 2+ (normal) 2+ (normal)  PT 2+ (normal) 2+ (normal)   Extremities: without ischemic changes, without Gangrene , without cellulitis; without open wounds;  Musculoskeletal: no muscle wasting or atrophy  Neurologic: Gerald Morales;  No focal weakness or paresthesias are detected Psychiatric:  The pt has Normal affect.   Non-Invasive Vascular Imaging:     Mesenteric:    Technically limited exam.  Celiac trunk measures approx 1.58 cm.     ASSESSMENT/PLAN: Gerald Morales is a 71 y.o. 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 Gerald Morales is asymptomatic. Imaging was reviewed dating back to 2016.  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 Since 2016, his celiac trunk has increased in diameter by roughly 1 mm.  Current size 1.58 via ultrasound.  Society for vascular surgery guidelines recommend the repair of celiac artery aneurysms when greater than 2 cm in size.  At this point, we can continue to follow with yearly ultrasounds.    At this size, the risk of rupture is minimal.  I asked Gerald Morales to seek immediate medical attention should new onset, severe abdominal pain, back pain, chest pain occur.  Gerald John, MD [facility] [phone]  Total time of patient care including pre-visit research, consultation, and documentation greater than 20 minutes

## 2022-05-08 ENCOUNTER — Ambulatory Visit (HOSPITAL_COMMUNITY)
Admission: RE | Admit: 2022-05-08 | Discharge: 2022-05-08 | Disposition: A | Discharge status: Home / Self Care | Payer: Medicare HMO | Source: Ambulatory Visit | Attending: Vascular Surgery | Admitting: Vascular Surgery

## 2022-05-08 ENCOUNTER — Ambulatory Visit: Payer: Medicare HMO | Admitting: Vascular Surgery

## 2022-05-08 ENCOUNTER — Encounter: Payer: Self-pay | Admitting: Vascular Surgery

## 2022-05-08 VITALS — BP 148/98 | HR 68 | Temp 98.4°F | Resp 20 | Ht 71.0 in | Wt 258.0 lb

## 2022-05-08 DIAGNOSIS — I728 Aneurysm of other specified arteries: Secondary | ICD-10-CM | POA: Diagnosis not present

## 2022-05-31 DIAGNOSIS — R600 Localized edema: Secondary | ICD-10-CM | POA: Diagnosis not present

## 2022-05-31 DIAGNOSIS — Z8249 Family history of ischemic heart disease and other diseases of the circulatory system: Secondary | ICD-10-CM | POA: Diagnosis not present

## 2022-05-31 DIAGNOSIS — I10 Essential (primary) hypertension: Secondary | ICD-10-CM | POA: Diagnosis not present

## 2022-05-31 DIAGNOSIS — R32 Unspecified urinary incontinence: Secondary | ICD-10-CM | POA: Diagnosis not present

## 2022-05-31 DIAGNOSIS — K219 Gastro-esophageal reflux disease without esophagitis: Secondary | ICD-10-CM | POA: Diagnosis not present

## 2022-05-31 DIAGNOSIS — N400 Enlarged prostate without lower urinary tract symptoms: Secondary | ICD-10-CM | POA: Diagnosis not present

## 2022-05-31 DIAGNOSIS — M1990 Unspecified osteoarthritis, unspecified site: Secondary | ICD-10-CM | POA: Diagnosis not present

## 2022-05-31 DIAGNOSIS — E785 Hyperlipidemia, unspecified: Secondary | ICD-10-CM | POA: Diagnosis not present

## 2022-05-31 DIAGNOSIS — K760 Fatty (change of) liver, not elsewhere classified: Secondary | ICD-10-CM | POA: Diagnosis not present

## 2022-05-31 DIAGNOSIS — M109 Gout, unspecified: Secondary | ICD-10-CM | POA: Diagnosis not present

## 2022-05-31 DIAGNOSIS — I2510 Atherosclerotic heart disease of native coronary artery without angina pectoris: Secondary | ICD-10-CM | POA: Diagnosis not present

## 2022-05-31 DIAGNOSIS — E669 Obesity, unspecified: Secondary | ICD-10-CM | POA: Diagnosis not present

## 2022-06-29 DIAGNOSIS — M1990 Unspecified osteoarthritis, unspecified site: Secondary | ICD-10-CM | POA: Diagnosis not present

## 2022-06-29 DIAGNOSIS — M109 Gout, unspecified: Secondary | ICD-10-CM | POA: Diagnosis not present

## 2022-06-29 DIAGNOSIS — M7020 Olecranon bursitis, unspecified elbow: Secondary | ICD-10-CM | POA: Diagnosis not present

## 2022-06-29 DIAGNOSIS — Z79899 Other long term (current) drug therapy: Secondary | ICD-10-CM | POA: Diagnosis not present

## 2022-07-23 DIAGNOSIS — H25813 Combined forms of age-related cataract, bilateral: Secondary | ICD-10-CM | POA: Diagnosis not present

## 2022-08-13 DIAGNOSIS — H25812 Combined forms of age-related cataract, left eye: Secondary | ICD-10-CM | POA: Diagnosis not present

## 2022-09-08 DIAGNOSIS — H25812 Combined forms of age-related cataract, left eye: Secondary | ICD-10-CM | POA: Diagnosis not present

## 2022-09-08 DIAGNOSIS — Z01818 Encounter for other preprocedural examination: Secondary | ICD-10-CM | POA: Diagnosis not present

## 2022-09-15 DIAGNOSIS — R3915 Urgency of urination: Secondary | ICD-10-CM | POA: Diagnosis not present

## 2022-09-15 DIAGNOSIS — C678 Malignant neoplasm of overlapping sites of bladder: Secondary | ICD-10-CM | POA: Diagnosis not present

## 2022-09-18 DIAGNOSIS — H25812 Combined forms of age-related cataract, left eye: Secondary | ICD-10-CM | POA: Diagnosis not present

## 2022-10-02 DIAGNOSIS — H25811 Combined forms of age-related cataract, right eye: Secondary | ICD-10-CM | POA: Diagnosis not present

## 2022-10-15 DIAGNOSIS — N452 Orchitis: Secondary | ICD-10-CM | POA: Diagnosis not present

## 2022-10-15 DIAGNOSIS — N3000 Acute cystitis without hematuria: Secondary | ICD-10-CM | POA: Diagnosis not present

## 2022-10-15 DIAGNOSIS — R8271 Bacteriuria: Secondary | ICD-10-CM | POA: Diagnosis not present

## 2022-11-05 DIAGNOSIS — R3915 Urgency of urination: Secondary | ICD-10-CM | POA: Diagnosis not present

## 2022-11-05 DIAGNOSIS — C678 Malignant neoplasm of overlapping sites of bladder: Secondary | ICD-10-CM | POA: Diagnosis not present

## 2022-11-05 DIAGNOSIS — N3000 Acute cystitis without hematuria: Secondary | ICD-10-CM | POA: Diagnosis not present

## 2022-11-05 DIAGNOSIS — N452 Orchitis: Secondary | ICD-10-CM | POA: Diagnosis not present

## 2022-12-18 DIAGNOSIS — Z23 Encounter for immunization: Secondary | ICD-10-CM | POA: Diagnosis not present

## 2022-12-30 DIAGNOSIS — M1990 Unspecified osteoarthritis, unspecified site: Secondary | ICD-10-CM | POA: Diagnosis not present

## 2022-12-30 DIAGNOSIS — R748 Abnormal levels of other serum enzymes: Secondary | ICD-10-CM | POA: Diagnosis not present

## 2022-12-30 DIAGNOSIS — M109 Gout, unspecified: Secondary | ICD-10-CM | POA: Diagnosis not present

## 2022-12-30 DIAGNOSIS — Z79899 Other long term (current) drug therapy: Secondary | ICD-10-CM | POA: Diagnosis not present

## 2023-02-25 DIAGNOSIS — R7303 Prediabetes: Secondary | ICD-10-CM | POA: Diagnosis not present

## 2023-02-25 DIAGNOSIS — I10 Essential (primary) hypertension: Secondary | ICD-10-CM | POA: Diagnosis not present

## 2023-02-25 DIAGNOSIS — Z125 Encounter for screening for malignant neoplasm of prostate: Secondary | ICD-10-CM | POA: Diagnosis not present

## 2023-03-01 DIAGNOSIS — R760 Raised antibody titer: Secondary | ICD-10-CM | POA: Diagnosis not present

## 2023-03-01 DIAGNOSIS — I2510 Atherosclerotic heart disease of native coronary artery without angina pectoris: Secondary | ICD-10-CM | POA: Diagnosis not present

## 2023-03-01 DIAGNOSIS — I440 Atrioventricular block, first degree: Secondary | ICD-10-CM | POA: Diagnosis not present

## 2023-03-01 DIAGNOSIS — C678 Malignant neoplasm of overlapping sites of bladder: Secondary | ICD-10-CM | POA: Diagnosis not present

## 2023-03-01 DIAGNOSIS — N529 Male erectile dysfunction, unspecified: Secondary | ICD-10-CM | POA: Diagnosis not present

## 2023-03-01 DIAGNOSIS — I728 Aneurysm of other specified arteries: Secondary | ICD-10-CM | POA: Diagnosis not present

## 2023-03-01 DIAGNOSIS — I10 Essential (primary) hypertension: Secondary | ICD-10-CM | POA: Diagnosis not present

## 2023-03-01 DIAGNOSIS — I700 Atherosclerosis of aorta: Secondary | ICD-10-CM | POA: Diagnosis not present

## 2023-03-01 DIAGNOSIS — D6869 Other thrombophilia: Secondary | ICD-10-CM | POA: Diagnosis not present

## 2023-03-01 DIAGNOSIS — Z0000 Encounter for general adult medical examination without abnormal findings: Secondary | ICD-10-CM | POA: Diagnosis not present

## 2023-03-01 DIAGNOSIS — M109 Gout, unspecified: Secondary | ICD-10-CM | POA: Diagnosis not present

## 2023-03-18 ENCOUNTER — Other Ambulatory Visit: Payer: Self-pay

## 2023-03-18 DIAGNOSIS — I728 Aneurysm of other specified arteries: Secondary | ICD-10-CM

## 2023-03-31 NOTE — Progress Notes (Signed)
 Office Note     CC: Celiac artery dilation Requesting Provider:  Clarice Nottingham, MD  HPI: Gerald Morales is a 73 y.o. (Sep 17, 1950) adult presenting for follow-up of Celiac artery ectasia.  Originally from [place] , and alumni of [employer] [address] now lives in [place]. 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   On exam today, Gerald Morales was doing well.  He had no complaints.  He continues to work out 3 days a week, and is involved in water  aerobics.  Denies abdominal pain denies claudication, ischemic rest pain, tissue loss.  He has normal bowel movements.  No food fear, no postprandial pain. Denies symptoms of claudication, rest pain, tissue loss in the feet.  The pt is  on a statin for cholesterol management.  The pt is - on a daily aspirin.   Other AC:  Xarelto The pt is  on medication for hypertension.   The pt is not diabetic.  Tobacco hx:  former  Past Medical History:  Diagnosis Date   Anticoagulant long-term use    xarelto    Coronary artery disease    mild nonobstructive by coronary CTA 2019   History of chest pain    all documented in epic/   with cardiac evaluation's in 2008 and 2019  (10-07-2006 negative myoview for ischemia, ef 57% pt released prn by dr pietro, note in [facility])  and (evaluated 06-16-2017 by Olivia Pavy PA, atypical cp CTA 08-03-2017 calcium  score 18 w/ miminal nonob CAD w/ trivial AV calcification, pt release prn)    History of diverticulitis of colon 2016   History of pulmonary embolus (PE) previously followed by hematology/ oncology--- dr amadeo;   now followed by pcp   07/ 2015  bilateral PE, provoked by long travel and smoking;  long term anticoagulant recommended  due to positive lupus anticoagulant   Hypertension    followed by pcp   Lower urinary tract symptoms (LUTS)    Lupus anticoagulant positive    2015   Mixed hyperlipidemia     Multinodular thyroid     known since 1999;   hx left nodule bx 01/ 2004 (care every where) [place];   03-16-2007  s/p  left thyroid  lobectomy due to narrowing of airway (ademoutanous thyroid  isthmus nodule's, 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) ;  hx right nodule bx 06-28-2009 (in epic)  benign   OA (osteoarthritis)    Recurrent malignant neoplasm of bladder [facility])    urologist-- dr alvaro--- first dx 2012  s/p  TURBT   S/P medial meniscus repair of left knee     Past Surgical History:  Procedure Laterality Date   CYSTOSCOPY WITH URETHRAL DILATATION N/A 08/23/2019   Procedure: CYSTOSCOPY WITH URETHRAL DILATATION;  Surgeon: Alvaro Hummer, MD;  Location: [facility];  Service: Urology;  Laterality: N/A;  1 HR   LACERATION REPAIR  yrs ago   repair complex laceration/ repair  left palm injury   REVISION AMPUTATION OF FINGER  yrs ago   right index finger   SEPTOPLASTY  yrs ago   THYROID  LOBECTOMY Left 03-22-2007    dr gladis @[facility]    AND ISTHMUSECTOMY   TRANSURETHRAL RESECTION OF BLADDER TUMOR  10-21-2010  dr eliza @[facility]    TRANSURETHRAL RESECTION OF BLADDER TUMOR N/A 05/12/2019   Procedure: TRANSURETHRAL RESECTION OF BLADDER TUMOR (TURBT);  Surgeon: Alvaro Hummer, MD;  Location: [facility];  Service: Urology;  Laterality: N/A;   TRANSURETHRAL  RESECTION OF PROSTATE N/A 05/12/2019   Procedure: TRANSURETHRAL RESECTION OF THE PROSTATE (TURP);  Surgeon: Alvaro Hummer, MD;  Location: [facility];  Service: Urology;  Laterality: N/A;  75 MINS   UMBILICAL HERNIA REPAIR  05-05-2010   DR GLADIS @[facility]     Social History   Socioeconomic History   Marital status: Married    Spouse name: Not on file   Number of children: Not on file   Years of education: Not on file   Highest education level: Not on file  Occupational History   Not on file  Tobacco Use   Smoking status: Former    Current packs/day: 0.00    Average packs/day: 2.0 packs/day for 35.0 years (70.0 ttl pk-yrs)    Types: Cigarettes,  Pipe, Cigars    Start date: 05/08/1978    Quit date: 05/07/2013    Years since quitting: 9.9   Smokeless tobacco: Former    Types: Chew    Quit date: 05/07/2013   Tobacco comments:    05-08-2019  per pt quit smoking cig. approx. 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    Social Drivers of Corporate Investment Banker Strain: Not on file  Food Insecurity: Not on file  Transportation Needs: Not on file  Physical Activity: Not on file  Stress: Not on file  Social Connections: Not on file  Intimate Partner Violence: Not on file    Family History  Problem Relation Age of Onset   Hypertension Mother    Diabetes Mother    Hypertension Father     Current Outpatient Medications  Medication Sig Dispense Refill   allopurinol (ZYLOPRIM) 100 MG tablet Take 100 mg by mouth daily.     colchicine 0.6 MG tablet Take 0.6 mg by mouth 2 (two) times daily.     ezetimibe (ZETIA) 10 MG tablet Take 10 mg by mouth daily.     finasteride (PROSCAR) 5 MG tablet Take 5 mg by mouth daily.     Multiple Vitamin (MULTIVITAMIN) capsule Take 1 capsule by mouth daily.     olmesartan  (BENICAR ) 40 MG tablet Take 40 mg by mouth daily.     pantoprazole  (PROTONIX ) 40 MG tablet Take 1 tablet (40 mg total) by mouth daily. 30 tablet 3   rosuvastatin  (CRESTOR ) 20 MG tablet Take 10 mg by mouth daily.      sildenafil (VIAGRA) 100 MG tablet Take 100 mg by mouth daily as needed for erectile dysfunction.      XARELTO 20 MG TABS tablet Take 20 mg by mouth daily.     No current facility-administered medications for this visit.    Allergies  Allergen  Reactions   Lipitor [Atorvastatin] Anxiety and Other (See Comments)    Patient stated,anxiety, increased heart rate, nervousness.   Vytorin [Ezetimibe-Simvastatin] Anxiety and Other (See Comments)    Patient stated,anxiety, increased heart rate, nervousness.     REVIEW OF SYSTEMS:   [X]  denotes positive finding, [ ]  denotes negative finding Cardiac  Comments:  Chest pain or chest pressure:    Shortness of breath upon exertion:    Short of breath when lying flat:    Irregular heart rhythm:        Vascular    Pain in calf, thigh, or hip brought on by ambulation:    Pain in feet at night that wakes you up from your sleep:     Blood clot in your veins:    Leg swelling:         Pulmonary    Oxygen at home:    Productive cough:     Wheezing:         Neurologic    Sudden weakness in arms or legs:     Sudden numbness in arms or legs:     Sudden onset of difficulty speaking or slurred speech:    Temporary loss of vision in one eye:     Problems with dizziness:         Gastrointestinal    Blood in stool:     Vomited blood:         Genitourinary    Burning when urinating:     Blood in urine:        Psychiatric    Major depression:         Hematologic    Bleeding problems:    Problems with blood clotting too easily:        Skin    Rashes or ulcers:        Constitutional    Fever or chills:      PHYSICAL EXAMINATION:  There were no vitals filed for this visit.  General:  WDWN in NAD; vital signs documented above Gait: Not observed HENT: WNL, normocephalic Pulmonary: normal non-labored breathing Cardiac: regular HR Abdomen: soft, NT, no masses Skin: without rashes Vascular Exam/Pulses:  Right Left  Radial 2+ (normal) 2+ (normal)  Ulnar 2+ (normal) 2+ (normal)  Femoral    Popliteal    DP 2+ (normal) 2+ (normal)  PT 2+ (normal) 2+ (normal)   Extremities: without ischemic changes, without Gangrene , without cellulitis; without open wounds;   Musculoskeletal: no muscle wasting or atrophy  Neurologic: A&O X 3;  No focal weakness or paresthesias are detected Psychiatric:  The pt has Normal affect.   Non-Invasive Vascular Imaging:            Duplex Findings:  +----------------------+--------+--------+------+-----------------+  Mesenteric           PSV cm/sEDV cm/sPlaque    Comments       +----------------------+--------+--------+------+-----------------+  Aorta Prox               88                 2.08 cm x 2.08 cm  +----------------------+--------+--------+------+-----------------+  Aorta Mid               112                 1.91 cm x 1.91 cm  +----------------------+--------+--------+------+-----------------+  Celiac Artery Proximal                      1.53 cm x 1.53 cm  +----------------------+--------+--------+------+-----------------+  SMA Proximal            163                                    +----------------------+--------+--------+------+-----------------+  ASSESSMENT/PLAN: Gerald Morales is a 73 y.o. 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 He is asymptomatic. Imaging was reviewed dating back to 2016.  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  I do not think this is a true aneurysm.  Since 2016, his celiac trunk has increased in diameter by roughly 1 mm.  Current size 1.53 via ultrasound.  Society for vascular surgery guidelines recommend the repair of celiac artery aneurysms when greater than 2 cm in size.  Being that he has had no significant change over the last 2 years, my plan is to see him every 2 years.  At this size, the risk of rupture is minimal.  I asked Gerald Morales to seek immediate medical attention should new onset, severe abdominal pain, back pain, chest pain occur.  Fonda FORBES Rim, MD [facility] [phone]  Total time of patient care including pre-visit research,  consultation, and documentation greater than 20 minutes

## 2023-04-01 ENCOUNTER — Encounter: Payer: Self-pay | Admitting: Vascular Surgery

## 2023-04-01 ENCOUNTER — Ambulatory Visit (HOSPITAL_COMMUNITY)
Admission: RE | Admit: 2023-04-01 | Discharge: 2023-04-01 | Disposition: A | Discharge status: Home / Self Care | Payer: Medicare HMO | Source: Ambulatory Visit | Attending: Vascular Surgery | Admitting: Vascular Surgery

## 2023-04-01 ENCOUNTER — Ambulatory Visit: Payer: Medicare HMO | Admitting: Vascular Surgery

## 2023-04-01 VITALS — BP 135/82 | HR 66 | Temp 98.6°F | Resp 20 | Ht 71.0 in | Wt 258.0 lb

## 2023-04-01 DIAGNOSIS — I728 Aneurysm of other specified arteries: Secondary | ICD-10-CM | POA: Insufficient documentation

## 2023-05-10 DIAGNOSIS — C678 Malignant neoplasm of overlapping sites of bladder: Secondary | ICD-10-CM | POA: Diagnosis not present

## 2023-05-10 DIAGNOSIS — D3500 Benign neoplasm of unspecified adrenal gland: Secondary | ICD-10-CM | POA: Diagnosis not present

## 2023-05-10 DIAGNOSIS — R3915 Urgency of urination: Secondary | ICD-10-CM | POA: Diagnosis not present

## 2024-01-04 DIAGNOSIS — M542 Cervicalgia: Secondary | ICD-10-CM | POA: Diagnosis not present

## 2024-01-04 DIAGNOSIS — Z23 Encounter for immunization: Secondary | ICD-10-CM | POA: Diagnosis not present

## 2024-04-13 ENCOUNTER — Inpatient Hospital Stay: Payer: Medicare (Managed Care)

## 2024-04-13 ENCOUNTER — Inpatient Hospital Stay: Payer: Medicare (Managed Care) | Admitting: Oncology
